# Patient Record
Sex: Female | Born: 1975
Health system: Southern US, Community
[De-identification: ages and names within clinical notes are randomized; demographics above are authoritative.]

## PROBLEM LIST (undated history)

## (undated) DIAGNOSIS — E282 Polycystic ovarian syndrome: Secondary | ICD-10-CM

## (undated) DIAGNOSIS — E559 Vitamin D deficiency, unspecified: Secondary | ICD-10-CM

## (undated) DIAGNOSIS — E669 Obesity, unspecified: Secondary | ICD-10-CM

## (undated) DIAGNOSIS — K5900 Constipation, unspecified: Secondary | ICD-10-CM

## (undated) DIAGNOSIS — M2550 Pain in unspecified joint: Secondary | ICD-10-CM

## (undated) DIAGNOSIS — F419 Anxiety disorder, unspecified: Secondary | ICD-10-CM

## (undated) DIAGNOSIS — E739 Lactose intolerance, unspecified: Secondary | ICD-10-CM

## (undated) DIAGNOSIS — D573 Sickle-cell trait: Secondary | ICD-10-CM

## (undated) DIAGNOSIS — R450 Nervousness: Secondary | ICD-10-CM

## (undated) HISTORY — DX: Pain in unspecified joint: M25.50

## (undated) HISTORY — DX: Anxiety disorder, unspecified: F41.9

## (undated) HISTORY — DX: Polycystic ovarian syndrome: E28.2

## (undated) HISTORY — DX: Lactose intolerance, unspecified: E73.9

## (undated) HISTORY — DX: Nervousness: R45.0

## (undated) HISTORY — DX: Vitamin D deficiency, unspecified: E55.9

## (undated) HISTORY — PX: CERVICAL CERCLAGE: SHX1329

## (undated) HISTORY — DX: Obesity, unspecified: E66.9

---

## 2004-03-17 ENCOUNTER — Ambulatory Visit (HOSPITAL_COMMUNITY): Admission: RE | Admit: 2004-03-17 | Discharge: 2004-03-17 | Payer: Self-pay | Admitting: Obstetrics and Gynecology

## 2004-05-11 ENCOUNTER — Encounter (HOSPITAL_COMMUNITY): Admission: RE | Admit: 2004-05-11 | Discharge: 2004-06-10 | Payer: Self-pay | Admitting: Obstetrics and Gynecology

## 2004-06-05 ENCOUNTER — Inpatient Hospital Stay (HOSPITAL_COMMUNITY): Admission: AD | Admit: 2004-06-05 | Discharge: 2004-08-02 | Payer: Self-pay | Admitting: Obstetrics and Gynecology

## 2004-08-04 ENCOUNTER — Encounter (HOSPITAL_COMMUNITY): Admission: AD | Admit: 2004-08-04 | Discharge: 2004-09-03 | Payer: Self-pay | Admitting: Obstetrics and Gynecology

## 2004-08-11 ENCOUNTER — Inpatient Hospital Stay (HOSPITAL_COMMUNITY): Admission: AD | Admit: 2004-08-11 | Discharge: 2004-08-11 | Payer: Self-pay | Admitting: Obstetrics and Gynecology

## 2004-09-10 ENCOUNTER — Encounter (HOSPITAL_COMMUNITY): Admission: RE | Admit: 2004-09-10 | Discharge: 2004-09-15 | Payer: Self-pay | Admitting: Obstetrics and Gynecology

## 2004-09-14 ENCOUNTER — Inpatient Hospital Stay (HOSPITAL_COMMUNITY): Admission: AD | Admit: 2004-09-14 | Discharge: 2004-09-19 | Payer: Self-pay | Admitting: Obstetrics and Gynecology

## 2004-09-17 ENCOUNTER — Encounter (INDEPENDENT_AMBULATORY_CARE_PROVIDER_SITE_OTHER): Payer: Self-pay | Admitting: Specialist

## 2004-12-27 ENCOUNTER — Other Ambulatory Visit: Admission: RE | Admit: 2004-12-27 | Discharge: 2004-12-27 | Payer: Self-pay | Admitting: Obstetrics and Gynecology

## 2006-03-07 ENCOUNTER — Other Ambulatory Visit: Admission: RE | Admit: 2006-03-07 | Discharge: 2006-03-07 | Payer: Self-pay | Admitting: Obstetrics and Gynecology

## 2006-03-11 ENCOUNTER — Encounter: Admission: RE | Admit: 2006-03-11 | Discharge: 2006-03-11 | Payer: Self-pay | Admitting: Obstetrics and Gynecology

## 2006-07-11 ENCOUNTER — Other Ambulatory Visit: Admission: RE | Admit: 2006-07-11 | Discharge: 2006-07-11 | Payer: Self-pay | Admitting: Obstetrics and Gynecology

## 2007-12-18 HISTORY — PX: TONSILLECTOMY AND ADENOIDECTOMY: SUR1326

## 2009-06-24 ENCOUNTER — Ambulatory Visit (HOSPITAL_COMMUNITY): Admission: RE | Admit: 2009-06-24 | Discharge: 2009-06-24 | Payer: Self-pay | Admitting: Obstetrics and Gynecology

## 2009-06-24 ENCOUNTER — Encounter (INDEPENDENT_AMBULATORY_CARE_PROVIDER_SITE_OTHER): Payer: Self-pay | Admitting: Obstetrics and Gynecology

## 2009-07-20 ENCOUNTER — Ambulatory Visit: Payer: Self-pay | Admitting: Family Medicine

## 2009-07-20 DIAGNOSIS — Z6838 Body mass index (BMI) 38.0-38.9, adult: Secondary | ICD-10-CM

## 2010-04-18 ENCOUNTER — Ambulatory Visit (HOSPITAL_COMMUNITY): Admission: RE | Admit: 2010-04-18 | Discharge: 2010-04-18 | Payer: Self-pay | Admitting: Obstetrics and Gynecology

## 2010-07-04 ENCOUNTER — Observation Stay (HOSPITAL_COMMUNITY): Admission: AD | Admit: 2010-07-04 | Discharge: 2010-07-05 | Payer: Self-pay | Admitting: Obstetrics and Gynecology

## 2010-07-12 ENCOUNTER — Inpatient Hospital Stay (HOSPITAL_COMMUNITY): Admission: AD | Admit: 2010-07-12 | Discharge: 2010-08-07 | Payer: Self-pay | Admitting: Obstetrics and Gynecology

## 2010-08-27 ENCOUNTER — Inpatient Hospital Stay (HOSPITAL_COMMUNITY): Admission: AD | Admit: 2010-08-27 | Discharge: 2010-08-27 | Payer: Self-pay | Admitting: Obstetrics and Gynecology

## 2010-09-20 ENCOUNTER — Inpatient Hospital Stay (HOSPITAL_COMMUNITY)
Admission: AD | Admit: 2010-09-20 | Discharge: 2010-09-20 | Payer: Self-pay | Source: Home / Self Care | Admitting: Obstetrics and Gynecology

## 2010-09-21 ENCOUNTER — Observation Stay (HOSPITAL_COMMUNITY): Admission: AD | Admit: 2010-09-21 | Discharge: 2010-09-22 | Payer: Self-pay | Admitting: Obstetrics and Gynecology

## 2010-09-26 ENCOUNTER — Inpatient Hospital Stay (HOSPITAL_COMMUNITY): Admission: AD | Admit: 2010-09-26 | Discharge: 2010-09-28 | Payer: Self-pay | Admitting: Obstetrics and Gynecology

## 2010-09-26 ENCOUNTER — Encounter (INDEPENDENT_AMBULATORY_CARE_PROVIDER_SITE_OTHER): Payer: Self-pay | Admitting: Obstetrics and Gynecology

## 2010-10-01 ENCOUNTER — Ambulatory Visit: Admission: RE | Admit: 2010-10-01 | Discharge: 2010-10-01 | Payer: Self-pay | Admitting: Obstetrics and Gynecology

## 2011-01-07 ENCOUNTER — Encounter: Payer: Self-pay | Admitting: Obstetrics and Gynecology

## 2011-01-14 LAB — CONVERTED CEMR LAB
Blood in Urine, dipstick: NEGATIVE
Nitrite: NEGATIVE
Specific Gravity, Urine: <1.005
WBC Urine, dipstick: NEGATIVE

## 2011-02-28 LAB — CBC
HCT: 28.1 % — ABNORMAL LOW (ref 36.0–46.0)
HCT: 30.7 % — ABNORMAL LOW (ref 36.0–46.0)
Hemoglobin: 10.4 g/dL — ABNORMAL LOW (ref 12.0–15.0)
MCH: 27.9 pg (ref 26.0–34.0)
MCH: 29 pg (ref 26.0–34.0)
MCHC: 33.7 g/dL (ref 30.0–36.0)
MCHC: 34 g/dL (ref 30.0–36.0)
MCV: 82.7 fL (ref 78.0–100.0)
RDW: 16.1 % — ABNORMAL HIGH (ref 11.5–15.5)

## 2011-02-28 LAB — RPR: RPR Ser Ql: NONREACTIVE

## 2011-03-01 LAB — WET PREP, GENITAL
Clue Cells Wet Prep HPF POC: NONE SEEN
Trich, Wet Prep: NONE SEEN

## 2011-03-01 LAB — URINALYSIS, ROUTINE W REFLEX MICROSCOPIC
Glucose, UA: NEGATIVE mg/dL
Specific Gravity, Urine: 1.01 (ref 1.005–1.030)

## 2011-03-01 LAB — URINE MICROSCOPIC-ADD ON

## 2011-03-01 LAB — FETAL FIBRONECTIN: Fetal Fibronectin: NEGATIVE

## 2011-03-01 LAB — URINE CULTURE

## 2011-03-02 LAB — DIFFERENTIAL
Basophils Absolute: 0 10*3/uL (ref 0.0–0.1)
Basophils Relative: 0 % (ref 0–1)
Eosinophils Relative: 5 % (ref 0–5)
Lymphocytes Relative: 13 % (ref 12–46)

## 2011-03-02 LAB — CBC
MCHC: 34 g/dL (ref 30.0–36.0)
MCV: 87.1 fL (ref 78.0–100.0)
Platelets: 223 10*3/uL (ref 150–400)
RDW: 15 % (ref 11.5–15.5)
WBC: 7.2 10*3/uL (ref 4.0–10.5)

## 2011-03-02 LAB — GLUCOSE, 1 HOUR GESTATIONAL: Glucose Tolerance, 1 hour: 141 mg/dL (ref 70–189)

## 2011-03-02 LAB — GLUCOSE TOLERANCE, 1 HOUR: Glucose, 1 Hour GTT: 153 mg/dL — ABNORMAL HIGH (ref 70–140)

## 2011-03-06 LAB — CBC
HCT: 34.4 % — ABNORMAL LOW (ref 36.0–46.0)
Hemoglobin: 11.8 g/dL — ABNORMAL LOW (ref 12.0–15.0)
MCHC: 34.1 g/dL (ref 30.0–36.0)
MCV: 88.1 fL (ref 78.0–100.0)
RBC: 3.91 MIL/uL (ref 3.87–5.11)
WBC: 9.5 10*3/uL (ref 4.0–10.5)

## 2011-03-25 LAB — URINE MICROSCOPIC-ADD ON

## 2011-03-25 LAB — CBC
Hemoglobin: 12.2 g/dL (ref 12.0–15.0)
MCHC: 34.5 g/dL (ref 30.0–36.0)
RBC: 4.29 MIL/uL (ref 3.87–5.11)
WBC: 5.1 10*3/uL (ref 4.0–10.5)

## 2011-03-25 LAB — PREGNANCY, URINE: Preg Test, Ur: NEGATIVE

## 2011-03-25 LAB — URINALYSIS, ROUTINE W REFLEX MICROSCOPIC
Bilirubin Urine: NEGATIVE
Glucose, UA: NEGATIVE mg/dL
Ketones, ur: NEGATIVE mg/dL
Protein, ur: NEGATIVE mg/dL
pH: 6.5 (ref 5.0–8.0)

## 2011-04-03 ENCOUNTER — Encounter: Payer: Self-pay | Admitting: Family Medicine

## 2011-04-03 ENCOUNTER — Ambulatory Visit (INDEPENDENT_AMBULATORY_CARE_PROVIDER_SITE_OTHER): Payer: 59 | Admitting: Family Medicine

## 2011-04-03 VITALS — BP 120/60 | Temp 98.6°F | Wt 234.0 lb

## 2011-04-03 DIAGNOSIS — R079 Chest pain, unspecified: Secondary | ICD-10-CM

## 2011-04-03 DIAGNOSIS — H10029 Other mucopurulent conjunctivitis, unspecified eye: Secondary | ICD-10-CM

## 2011-04-03 DIAGNOSIS — E669 Obesity, unspecified: Secondary | ICD-10-CM

## 2011-04-03 DIAGNOSIS — H109 Unspecified conjunctivitis: Secondary | ICD-10-CM

## 2011-04-03 MED ORDER — MOXIFLOXACIN HCL 0.5 % OP SOLN: 1.0000 [drp] | Freq: Three times a day (TID) | OPHTHALMIC | Status: AC

## 2011-04-03 MED ORDER — PHENTERMINE HCL 37.5 MG PO CAPS: 37.5000 mg | ORAL_CAPSULE | ORAL | Status: AC

## 2011-04-03 NOTE — Patient Instructions (Signed)
Obesity Obesity is defined as having a Body Mass Index (BMI) of 30 or more. To calculate your BMI divide your weight in pounds by your height in inches squared and multiply that product by 703. Major illnesses resulting from long-term obesity include:  Stroke.   Heart disease.   Diabetes.   Many cancers.   Arthritis.  Obesity also complicates recovery from many other medical problems.  CAUSES  A history of obesity in your parents.   Thyroid hormone imbalance.   Environmental factors such as excess calorie intake and physical inactivity.  TREATMENT A healthy weight loss program includes:  A calorie restricted diet based on individual calorie needs.   Increased physical activity (exercise).  An exercise program is just as important as the right low calorie diet.  Weight-loss medicines should be used only under the supervision of your physician. These medicines help, but only if they are used with diet and exercise programs. Medicines can have side effects including nervousness, nausea, abdominal pain, diarrhea, headache, drowsiness, and depression.  An unhealthy weight loss program includes:  Fasting.   Fad diets.   Supplements and drugs.  These choices do not succeed in long-term weight control.  HOME CARE INSTRUCTIONS To help you make the needed dietary changes:   Keep a daily record of everything you eat. There are many free websites to help you with this. It may be helpful to measure your foods so you can determine if you are eating the correct portion sizes.   Use low-calorie cookbooks or take special cooking classes.   Avoid alcohol. Drink more water and drinks with no calories.   Take vitamins and supplements only as recommended by your caregiver.   Weight-loss support groups, Registered Dieticians, counselors, and stress reduction education can also be very helpful.  PREVENTION Losing weight and keeping it off takes time, discipline, a healthy diet and regular  exercise. Document Released: 01/10/2005 Document Re-Released: 12/23/2007 Exit[employer] Patient Information 2011 [employer]. 

## 2011-04-03 NOTE — Progress Notes (Signed)
  Subjective:    [name] is a 35 y.o. female who presents for evaluation of discharge, erythema and itching in the left eye. She has noticed the above symptoms for 5 days. Onset was gradual. Patient denies blurred vision, foreign body sensation, pain, photophobia, tearing and visual field deficit. There is a history of other family members with similar symptoms.  The following portions of the patient's history were reviewed and updated as appropriate: allergies, current medications, past family history, past medical history, past social history, past surgical history and problem list.  Review of Systems Pertinent items are noted in HPI.   Objective:    BP 120/60  Temp(Src) 98.6 F (37 C) (Oral)  Wt 234 lb (106.142 kg)      General: alert, cooperative, appears stated age and no distress  Eyes:  positive findings: conjunctiva: 1+ injection.  Yellow d/c  Vision:   20/15 both eyes  Fluorescein:  not done   lungs--CTAB/L,  No RRW Heart-- +S1S2 no murmur  Assessment:     Acute conjunctivitis  obesity  Plan:    Ophthalmic drops per orders.   diet and exercise d/w pt--- preventation diet--Flat Belly diet

## 2011-05-01 NOTE — H&P (Signed)
NAMELOANNE, EMERY                ACCOUNT NO.:  [id number]   MEDICAL RECORD NO.:  [id number]          PATIENT TYPE:  AMB   LOCATION:  SDC                           [facility]  [facility]   PHYSICIAN:  Janine Limbo, M.D.DATE OF BIRTH:  [birth year]   DATE OF ADMISSION:  DATE OF DISCHARGE:                              HISTORY & PHYSICAL   HISTORY OF PRESENT ILLNESS:  Ms. [name] is a 35 year old female, para 0-  2-0-1, who presents for hysteroscopy with resection and dilatation and  curettage.  The patient has been followed at the [facility] of [facility] for women.  The patient has irregular  bleeding.  An ultrasound was performed, which showed an 8.73 x 6.00 cm  uterus with endometrial polyps.  A total of 8 polyps were seen, and the  largest polyp measures 1.65 cm in size.  The patient also has a history  of anemia.  Her hemoglobin was 11.3.  Her most recent Pap smear was  within normal limits.   OBSTETRICAL HISTORY:  The patient has had a 35-week vaginal delivery.  She has also had a 20-week delivery of a nonviable infant.   DRUG ALLERGIES:  No known drug allergies.   PAST MEDICAL HISTORY:  The patient has a history of sickle cell trait.  She also has a history of hematuria of uncertain etiology.   SOCIAL HISTORY:  The patient denies cigarette use and other recreational  drug uses.  She is currently married and she works as a Engineer, civil (consulting).   REVIEW OF SYSTEMS:  The patient has constipation and fatigue.   FAMILY HISTORY:  Noncontributory.   PHYSICAL EXAMINATION:  VITAL SIGNS:  Weight is 214 pounds.  Height is 5  feet 6 inches.  HEENT:  Within normal limits.  CHEST:  Clear.  HEART:  Regular rate and rhythm.  BREASTS:  Without masses.  ABDOMEN:  Nontender.  EXTREMITIES:  Grossly normal.  NEUROLOGIC:  Grossly normal.  PELVIC:  External genitalia is normal.  Vagina is normal.  Cervix is  nontender.  Uterus is normal size, shape, and consistency.  Adnexa,  no  masses.   ASSESSMENT:  1. Irregular uterine bleeding.  2. Endometrial polyps.  3. Anemia (hemoglobin 11.3).   PLAN:  The patient will undergo a hysteroscopy with resection of polyps.  She will also have a dilatation and curettage.  The patient understands  the indications for her surgical procedure and she accepts the risks of,  but not limited to, anesthetic complications, bleeding, infection, and  possible damage to surrounding organs.      Janine Limbo, M.D.  Electronically Signed     AVS/MEDQ  D:  06/15/2009  T:  06/16/2009  Job:  [id number]

## 2011-05-01 NOTE — Op Note (Signed)
[name], [name]                ACCOUNT NO.:  [id number]   MEDICAL RECORD NO.:  [id number]          PATIENT TYPE:  AMB   LOCATION:  SDC                           [facility]   PHYSICIAN:  Janine Limbo, M.D.DATE OF BIRTH:  Apr 22, 1976   DATE OF PROCEDURE:  06/24/2009  DATE OF DISCHARGE:  06/24/2009                               OPERATIVE REPORT   PREOPERATIVE DIAGNOSES:  1. Menorrhagia.  2. Endometrial polyps.  3. Anemia (hemoglobin 11.3 in the office.)   POSTOPERATIVE DIAGNOSES:  1. Menorrhagia.  2. Endometrial polyps.  3. Anemia (hemoglobin 11.3 in the office.)   PROCEDURE:  1. Hysteroscopy with resection of polyps.  2. Dilatation and curettage.   SURGEON:  Janine Limbo, MD   FIRST ASSISTANT:  None.   ANESTHETIC:  General.   DISPOSITION:  [name] is a 35 year old female, para 0-2-0-1, who  presents with the above-mentioned diagnosis.  She understands the  indications for her surgical procedure and she accepts the risks of, but  not limited to, anesthetic complications, bleeding, infections, and  possible damage to the surrounding organs.   FINDINGS:  The uterus was noted to be normal size and consistency.  No  adnexal masses were appreciated.  On hysteroscopy, the patient was noted  to have multiple endometrial polyps.  No other pathology was  appreciated.   PROCEDURE:  The patient was taken to the operating room where a general  anesthetic was given.  The patient's abdomen, perineum, and vagina were  prepped with multiple layers of Betadine.  The bladder was drained of  urine.  The patient was sterilely draped.  Examination under anesthesia  was performed.  A paracervical block was placed using 10 mL of 0.5%  Marcaine with epinephrine.  An endocervical curettage was performed.  The uterus sounded to 9 cm.  The cervix was gently dilated.  The  operative hysteroscope was inserted.  Pictures were taken of the  patient's intrauterine anatomy.  We  used a single loop to resect all of  the polyps in the endometrial cavity.  We then used a medium sharp  curette to curette the cavity until it was felt to be clean.  Hemostasis  was noted to be adequate.  The hysteroscope was reinserted and the  cavity was carefully inspected.  No other pathology was appreciated.  All instruments were removed.  The examination was repeated and the  uterus was noted to be firm.  Sponge, needle, and instrument counts were  correct on 2 occasions.  The estimated blood loss was 10 mL.  The  estimated fluid deficit was 40 mL of glycine.  The patient tolerated her  procedure well.  She was awakened from her anesthetic without difficulty  and transported to the recovery room in stable condition.  The  endocervical curettage, endometrial curettage, and the endometrial  resections were sent to pathology for evaluation.   FOLLOWUP INSTRUCTIONS:  The patient was given a prescription for:  1. Motrin 800 mg every 8 hours as needed for mild-to-moderate pain.  2. Vicodin 1 or 2 tablets every 4 hours as  needed for severe pain.  3. Zofran 8 mg every 8 hours as needed for nausea.  The patient will      return to see Dr. Stefano Gaul in 2-3 weeks for followup examination.      She was given a copy of the postoperative instruction sheet as      prepared by the [facility] for patient's, who      have undergone a dilatation and curettage.       Janine Limbo, M.D.  Electronically Signed     AVS/MEDQ  D:  06/24/2009  T:  06/25/2009  Job:  [id number]

## 2011-05-04 NOTE — H&P (Signed)
[name], [name]                ACCOUNT NO.:  [id number]   MEDICAL RECORD NO.:  [id number]          PATIENT TYPE:  INP   LOCATION:  9169                          [facility]ILITY[facility]  [facility]   PHYSICIAN:  Marie L. Williams, C.N.M.DATE OF BIRTH:  1976-06-27   DATE OF ADMISSION:  09/14/2004  DATE OF DISCHARGE:                                HISTORY & PHYSICAL   HISTORY OF PRESENT ILLNESS:  This is a 35 year old gravida 2, para 1-0-0 at  34-4/7 weeks who presents from the office with cervix dilated 3-4 cm, 90%  effaced and bulging membranes with cerclage half way out.  She denies  leaking or bleeding and reports positive fetal movement.  Pregnancy has been  followed by the M.D. service and remarkable for:  1) Previous 20 week loss.  2) Preterm labor with preterm delivery.  3) Sickle cell trait.  4) Group B  strep positive.  5) Preterm labor this pregnancy.   PAST OBSTETRICAL HISTORY:  Remarkable for vaginal delivery in 1997 of a  female infant at 15 weeks' gestation who died two hours after birth.   PAST MEDICAL HISTORY:  1.  History of for incompetent cervix in 1997 with preterm birth.  2.  History of recurrent yeast infections.  3.  Childhood varicella.   PAST SURGICAL HISTORY:  Remarkable only for childbirth.   FAMILY HISTORY:  Remarkable for a grandmother with heart disease.  Mother  and grandfather with hypertension.  Aunt with varicosities.  Aunt with non-  insulin-dependent diabetes mellitus.  Aunt with renal disease.  Grandfather  with prostate cancer.  Grandmother with rectal cancer.  Grandfather with  stroke.   GENETIC HISTORY:  Remarkable for a patient with sickle cell trait in the  patient's cousin with sickle cell trait.   SOCIAL HISTORY:  The patient is single.  The father of the baby, __________  [name], is intermittently involved.  The patient works as a Theatre stage manager  in [employer]  She is of the [employer].  She denies any alcohol,  tobacco or drug use.   PRENATAL LABORATORY DATA:  Hemoglobin 12.1, platelets 292,000.  Blood type B  positive.  Antibody screen negative.  Sickle cell positive for trait.  RPR  nonreactive.  Rubella immune.  Hepatitis negative.  HIV negative.  Hepatitis  test normal.  Gonorrhea negative.  Chlamydia negative.  Cystofibrosis  negative.  Clot screen normal.  Group B strep positive.   HISTORY OF CURRENT PREGNANCY:  The patient entered care at 10 weeks'  gestation. She was counseled on Delalutin protocol for history of preterm  delivery which began at 16 weeks on a weekly basis.  She was noted to be 2-3  cm dilated at 19 weeks and was admitted to the hospital for cerclage where  she stayed for several weeks.  Since then, she has been able to go home and  has done well at home and presents now in early labor.   PHYSICAL EXAMINATION:  VITAL SIGNS:  Stable, afebrile.  HEENT:  Within normal limits.  NECK:  Thyroid normal, not enlarged.  CHEST:  Clear  to auscultation.  HEART:  Regular rate and rhythm.  ABDOMEN:  EFM shows reactive fetal heart rate with uterine contractions  every 1-1/2 to two minutes which are mild.  The cervix is 3-4 cm, 90%,  bulging membranes per Dr. Su Hilt.  Cerclage is noted to be half way out.  Extremities within normal limits.   ASSESSMENT:  1.  Intrauterine pregnancy at 34-4/7 weeks.  2.  Preterm cervical dilation with preterm labor.  3.  Cerclage.   PLAN:  1.  Admit to birthing suites per Dr. Su Hilt.  2.  Routine M.D. orders.  3.  Plan per M.D.      MLW/MEDQ  D:  09/14/2004  T:  09/14/2004  Job:  [id number]

## 2011-05-04 NOTE — Op Note (Signed)
NAMESHIORI, ADCOX                          ACCOUNT NO.:  [id number]   MEDICAL RECORD NO.:  [id number]                   PATIENT TYPE:  INP   LOCATION:  9151                                 [facility]ILITY[facility]  [facility]   PHYSICIAN:  Janine Limbo, M.D.            DATE OF BIRTH:  23-Jul-1976   DATE OF PROCEDURE:  06/07/2004  DATE OF DISCHARGE:                                 OPERATIVE REPORT   PREOPERATIVE DIAGNOSES:  1. [redacted] weeks gestation.  2. Incompetent cervix.   POSTOPERATIVE DIAGNOSES:  1. [redacted] weeks gestation.  2. Incompetent cervix.   PROCEDURE:  Cervical cerclage as a rescue procedure.   OBSTETRICIAN:  Janine Limbo, M.D.   ANESTHESIA:  Spinal.   DISPOSITION:  [name] is a 35 year old female, gravida 2, para 0-1-0-0,  who presented to the [facility] on June 05, 2004 when  she was noted to be dilated 2-3 cm and 90% effaced.  The patient was given  antibiotics and placed in Trendelenburg position.  She presents at this  point for a cervical cerclage as a rescue procedure.  We carefully reviewed  the risk and benefits of observation only as well as the risks and benefits  of cerclage as a rescue procedure.  The patient understands that there is a  significant risk of infection whether she has the cerclage or not. She  understands that there is a significant risk of bleeding as we do the  procedure. She understands that there is a 50% risk according to some  studies that her membranes will ruptured at the time the procedure is  performed.  She understands, however, that without the cerclage that the  natural history of condition is that she will deliver in the near future and  that her infant at this point has no chance of survival. After carefully  considering all of her risks, she has decided to proceed with cervical  cerclage.   FINDINGS:  The patient presented to the operating room and on speculum exam,  she was found to be 3-4 cm dilated at this  point. This is a significant  change from when she was first admitted two days ago at which point her  cervix was 2-3 cm dilated.  The membranes are slightly bulging today.  We  did review these findings prior to proceeding with the procedure. The  patient again stated that it was her desire that we proceed. The perineum  and vagina were prepped with multiple layers of Betadine.  A Foley catheter  was placed in the bladder. The patient was sterilely draped.  A speculum was  placed in the vagina and the vagina was very carefully prepped with  Betadine.  The cervix was held using special ring forceps.  We attempted to  place a Foley catheter in the cervix to push the membranes back. We felt  that this was a suboptimal method for placing  the cerclage.  We then used a  narrow malleable retractor to gently retract the membranes to the side.  We  were able to place a 5 mm Mersilene stitch in a circumferential pursestring  fashion around the cervix without damage the membranes.  The knot was tied  in the posterior vagina.  Hemostasis was adequate at the end of our  procedure. The cervix was noted to be closed.  The cervix appeared to be  approximately 25-50% effaced at the end of our procedure. We were very  pleased with the  result.  Sponge, needle and instrument counts were correct.  The estimated  blood loss for the procedure was 30 mL.  The patient was taken to the  recovery room in stable condition. We will keep the patient in the hospital  for observation.  If she remains stable, then we will consider discharging  the patient to home.                                               Janine Limbo, M.D.    AVS/MEDQ  D:  06/07/2004  T:  06/07/2004  Job:  [id number]

## 2011-05-04 NOTE — H&P (Signed)
NAME:  [name], [name]                          ACCOUNT NO.:  [id number]   MEDICAL RECORD NO.:  [id number]                   PATIENT TYPE:  INP   LOCATION:  9151                                 [facility]   PHYSICIAN:  Crist Fat. Rivard, M.D.              DATE OF BIRTH:  [birth year]   DATE OF ADMISSION:  06/05/2004  DATE OF DISCHARGE:                                HISTORY & PHYSICAL   Ms. [name] is a 35 year old gravida 2, para 0-0-1-0 at 19-4/7 weeks who  presents from the office with cervical dilatation noted on ultrasound of 2  to 3 cm and no measurable cervix.  Her history had been remarkable for a 20-  week delivery in 1997 with onset of contractions, cramping, and then  ruptured membranes with subsequently delivery.  The patient has denied any  significant cramping, discharge, bleeding, or any other problems.  The  ultrasound that was done today was done for a routine anatomy ultrasound.  Pregnancy has been remarkable for 1) previous delivery at 20 weeks.  2)  History of sickle cell trait.  3) Patient on Delalutin weekly secondary to  history of preterm labor.   PRENATAL LABORATORY DATA:  Blood type B positive, Rh antibody negative, VDRL  nonreactive, rubella titer positive, hepatitis B surface antigen negative,  HIV nonreactive, GC and chlamydia cultures negative.  In April, Pap was  normal.  In December 2004, cystic fibrosis testing was negative.  Quadruple  screen was negative.  Hemoglobin upon entering the practice was 12.1.  Her  platelets were 292.  Sickle cell trait was positive.  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  EDC of October 22, 2004, was established by  ultrasound at 8 weeks secondary to somewhat questionable last menstrual  period.   HISTORY OF PRESENT PREGNANCY:  The patient entered care at approximately 10  weeks.  Recent information was sent to [facility] for her history of onset  of labor at 20 weeks.  Hemoglobin electrophoresis showed  positive sickle  cell trait and possible beta thalassemia trait.  Dr. Genia Hotter consulted, and  Delalutin was begun at 16 weeks.  She was sent to Dr. Cyndie Chime for a  consult secondary to questionable beta thalassemia trait.  She had  appointment with him on approximately June 6.  No records are available at  present of that visit.  The patient had an ultrasound at approximately 16  weeks that showed a cervical length of 3.0.  Her anatomy ultrasound today  then showed a cervical dilatation of 2 to 3 cm with no measurable cervix.   PAST OBSTETRICAL HISTORY:  In 1997, the patient had a vaginal delivery of a  20-week female infant after 7 to 8 hours in labor.  She then had ruptured  membranes.  The baby lives 2 hours.   PAST MEDICAL HISTORY:  1. History of some recurrent yeast infections.  2. She  was on oral contraceptives until February 2005.  3. She reports the usual childhood illnesses.  4. She had one UTI in the past.  5. The patient also has sickle cell trait.   ALLERGIES:  No known medication allergies.   FAMILY HISTORY:  Maternal grandmother had heart disease and is now deceased.  Maternal mother and paternal grandfather had chronic hypertension.  Paternal  aunt had varicosities.  Paternal great grandfather had non-insulin-dependent  diabetes.  Maternal aunt had chronic renal disease.  Grandfather had  prostate cancer.  Paternal grandmother had rectal cancer.  Paternal  grandfather had a stroke.  Father and mother were alcohol and drug users.   HOSPITALIZATION:  The patient's only hospitalization was for childbirth.   GENETIC HISTORY:  Remarkable for patient having sickle cell trait and  patient's cousin having trait.   SOCIAL HISTORY:  The patient just graduated form [employer] nursing school.  Her  partner has two years of college.  He is employed as a Programmer, applications.  She is single.  Father of the baby is involved and supportive.  His name is Perlie Mayo.  The patient  is African-American of [address]  faith.  She has been followed by the physician's service at [facility].  She denies any alcohol, drug, or tobacco use during this  pregnancy.   PHYSICAL EXAMINATION:  VITAL SIGNS:  Stable.  The patient is afebrile.  HEENT:  Within normal limits.  LUNGS:  Bilateral breaths sounds are clear.  HEART:  Regular rate and rhythm without murmur.  BREASTS:  Soft and nontender.  ABDOMEN:  Fundal height is approximately 19 to 20 weeks.  PELVIC:  Cervical exam was done by Dr. Stefano Gaul.  Cervix 2 to 3 cm, 90%  effaced, membranes not bulging.  GC, chlamydia and group B strep cultures  were collected.  EXTREMITIES:  Deep tendon reflexes 2+ without clonus.  There was no edema  noted.   IMPRESSION:  1. Intrauterine pregnancy at 19-4/7 weeks.  2. Cervical incompetence.  3. History of previous 20-week loss.   PLAN:  1. Admit to [facility] for consult with Dr. Silverio Lay, as attending physician.  2. Complete bed rest in Trendelenburg with Foley.  3. Unasyn IV until culture results are available.  4. Motrin 600 mg p.o. q.6h.  5. Continue with tocolysis p.r.n.  6. Dr. Estanislado Pandy has reviewed the reserved prognosis at this time as well as     risks and benefits of rescue cerclage.  The plan will be most likely be     attempt to place this after 24 to 48 hours after antibiotic coverage.     Renaldo Reel Emilee Hero, C.N.M.                   Crist Fat Rivard, M.D.    Leeanne Mannan  D:  06/05/2004  T:  06/05/2004  Job:  [id number]

## 2011-05-04 NOTE — Discharge Summary (Signed)
NAME:  [name], [name]                          ACCOUNT NO.:  [id number]   MEDICAL RECORD NO.:  [id number]                   PATIENT TYPE:  INP   LOCATION:  9151                                 FACILITY:  [facility]   PHYSICIAN:  Naima A. Dillard, M.D.              DATE OF BIRTH:  Jan 23, 1976   DATE OF ADMISSION:  06/05/2004  DATE OF DISCHARGE:  08/02/2004                                 DISCHARGE SUMMARY   ADMITTING DIAGNOSIS:  Intrauterine pregnancy at 19 weeks 4 days with  cervical incompetence.   PROCEDURE:  Rescue cervical cerclage at [redacted] weeks gestation for incompetent  cervix.   DISCHARGE DIAGNOSES:  1. Intrauterine pregnancy at 19 weeks 4 days with cervical incompetence.  2. Rescue cervical cerclage at [redacted] weeks gestation for incompetent cervix.   [name] is a 35 year old gravida 2 para 0-0-1-0 who presented at 19 weeks  4 days gestation having been seen in the office on that day by Dr. Stefano Gaul  for routine prenatal visit.  She had a routine anatomy scan on ultrasound  which revealed no cervical length.  She was evaluated by Dr. Stefano Gaul and  the cervix at that time was 2-3 cm dilated, 90% effaced, membranes not  bulging and intact.  The patient was admitted without any signs of preterm  labor and good fetal movement for bedrest in Trendelenburg position.  She  was advised of the prognosis of cervical dilation in early pregnancy prior  to fetal viability.  The patient understands and elects to remain in the  hospital on bedrest.  Discussion was made with the patient regarding her  options including the risks and benefits of observation only and the risks  of rescue cervical cerclage.  The patient decided to proceed with cerclage  which was performed on June 07, 2004 by Dr. Marline Backbone.  The patient  has remained in the hospital on bedrest since that time.  She has had no  symptoms of preterm labor since admission and has remained stable in her  vital signs with no signs of  infection, no rupture of membranes, no  bleeding, no contractions.  Her course has in that respect remained  uncomplicated.  She has received physical therapy for prolonged bedrest.  She has had social work and chaplain consults.  She has had some difficulty  with periods of depression and has had some breakdown of the skin on her  buttocks which has been relieved with cream.  She was followed with  ultrasound at 24 weeks which showed cervical length on ultrasound 1.5 cm.  She received betamethasone at that time at 24 weeks on July 05, 2004.  She  received betamethasone on that day and 24 hours later.  The ultrasound  specifically showed a vertex presentation with approximate estimated fetal  weight of 616 g, AFI lower limit of normal, cervix 1.5 cm in length, 1.1 cm  dilation of internal os  with funneling.  At that time discussion was held  with the patient regarding her status with regard to incompetent cervix,  cervical length and dilation, and dynamic cervix.  Decision was made to  remain in Trendelenburg bedrest until [redacted] weeks gestation with reevaluation  by ultrasound.  She began weekly Delalutin injections at 24 weeks, and again  has remained without any signs or symptoms of infection and no signs or  symptoms of preterm labor.  She has not had any contractions or cramping.  No bleeding or leaking of fluid.  Her fetal heart rate has remained  reassuring with positive fetal movement.  The problems that the patient has  had have revolved around prolonged bedrest as constipation and hemorrhoids,  depression.  At 28 weeks, ultrasound exam was performed which showed an  average gestational age fetus with an estimated fetal weight of 1153 g on  the 50th to 75th percentile, AFI normal, cervical length below cerclage was  1.4 cm of total cervical length, funneling above cerclage to 3.4 cm,  internal os dilated 1.1 to 1.3 cm, essentially unchanged from previous  ultrasound exam.  At this  point at 28 weeks on July 31, 2004 the patient  was allowed to advance activity.  She was sitting up for meals and had  bathroom privileges.  The plan was made to recheck cervix via ultrasound in  48 hours.  Repeat ultrasound was done today on August 02, 2004 with findings  of cervix decreased in length to 1.1 cm with funneling.  Because of cervical  shortening the patient was advised to remain in hospital on bedrest.  However, after long discussion between the patient and Dr. Jaymes Graff,  the patient chose to leave AMA.  However, the patient agreed to home health  nursing and will continue bedrest at home with weekly Delalutin injections  and weekly visits to [facility].  Her next appointment is scheduled on August 11, 2004 with Dr. Jaymes Graff.  She is to start Motrin 600 mg p.o. q.6h. until  Monday - August 07, 2004.  She is to call the office of [facility] for any signs  or symptoms or preterm labor, rupture of membranes, decreased fetal  movement, or temperature greater than 100.4.  All arrangements have been  made with [facility] for the patient's care at home.     Rica Koyanagi, C.N.M.               Naima A. Normand Sloop, M.D.    SDM/MEDQ  D:  08/02/2004  T:  08/02/2004  Job:  [id number]

## 2012-05-19 ENCOUNTER — Encounter: Payer: Self-pay | Admitting: Obstetrics and Gynecology

## 2012-05-19 ENCOUNTER — Ambulatory Visit (INDEPENDENT_AMBULATORY_CARE_PROVIDER_SITE_OTHER): Payer: BC Managed Care – PPO | Admitting: Obstetrics and Gynecology

## 2012-05-19 VITALS — BP 124/70 | Resp 16 | Ht 65.5 in | Wt 234.0 lb

## 2012-05-19 DIAGNOSIS — L680 Hirsutism: Secondary | ICD-10-CM

## 2012-05-19 DIAGNOSIS — Z124 Encounter for screening for malignant neoplasm of cervix: Secondary | ICD-10-CM

## 2012-05-19 DIAGNOSIS — N898 Other specified noninflammatory disorders of vagina: Secondary | ICD-10-CM

## 2012-05-19 DIAGNOSIS — R635 Abnormal weight gain: Secondary | ICD-10-CM | POA: Insufficient documentation

## 2012-05-19 DIAGNOSIS — N949 Unspecified condition associated with female genital organs and menstrual cycle: Secondary | ICD-10-CM

## 2012-05-19 DIAGNOSIS — Z139 Encounter for screening, unspecified: Secondary | ICD-10-CM

## 2012-05-19 DIAGNOSIS — Z01419 Encounter for gynecological examination (general) (routine) without abnormal findings: Secondary | ICD-10-CM

## 2012-05-19 LAB — CBC
Hemoglobin: 12.6 g/dL (ref 12.0–15.0)
MCH: 28.1 pg (ref 26.0–34.0)
MCHC: 33.9 g/dL (ref 30.0–36.0)
MCV: 82.9 fL (ref 78.0–100.0)
RBC: 4.49 MIL/uL (ref 3.87–5.11)

## 2012-05-19 LAB — POCT WET PREP (WET MOUNT): Clue Cells Wet Prep Whiff POC: NEGATIVE
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
## 2012-05-20 LAB — TESTOSTERONE, FREE, TOTAL, SHBG
Sex Hormone Binding: 30 nmol/L (ref 18–114)
Testosterone, Free: 9.4 pg/mL — ABNORMAL HIGH (ref 0.6–6.8)
Testosterone-% Free: 1.9 % (ref 0.4–2.4)

## 2012-05-21 ENCOUNTER — Telehealth: Payer: Self-pay

## 2012-05-21 DIAGNOSIS — E559 Vitamin D deficiency, unspecified: Secondary | ICD-10-CM

## 2012-05-21 LAB — PAP IG W/ RFLX HPV ASCU

## 2012-05-21 NOTE — Telephone Encounter (Signed)
Left message for pt to return call regarding Vit-D protocol. Mathis Bud

## 2012-05-21 NOTE — Progress Notes (Signed)
Dr. Su Hilt, pt was called and given Vit-D protocol, and the pt asked about other lab result. Pt was concerned about her elevated Free Testosterone level. Pt wanted to bring to your attention that she does have facial hair, and is there any protocol regarding that? Mathis Bud

## 2012-05-21 NOTE — Telephone Encounter (Signed)
Pt was called and given Vit-D protocol. RX was called in to [address] for Vit-D softjels 50,000 units 1 capsule 1x wk for 12wks #20 0rf. Pt asked for other test results and was concerned about her elevated Free Testosterone level. Pt was advised that a note will be forwarded to Dr. Su Hilt. The pt was advised that she will receive a call with protocol after Dr. Su Hilt review message. Future lab ordered and also scheduled per pt request. Lab ov for 08/07/2012 @ 11:30am, Mathis Bud

## 2012-05-23 ENCOUNTER — Telehealth: Payer: Self-pay | Admitting: Obstetrics and Gynecology

## 2012-05-26 ENCOUNTER — Telehealth: Payer: Self-pay

## 2012-05-26 NOTE — Telephone Encounter (Signed)
To close document. [name]

## 2012-05-26 NOTE — Telephone Encounter (Signed)
Left message for pt to return call regarding lab results. [name]  

## 2012-05-28 ENCOUNTER — Telehealth: Payer: Self-pay | Admitting: Obstetrics and Gynecology

## 2012-05-29 ENCOUNTER — Telehealth: Payer: Self-pay

## 2012-05-29 NOTE — Telephone Encounter (Signed)
Pt returned call regarding lab results. Pt was advised of elevated free testosterone level suggesting PCO's, pt does not have any questions right now. Pt stated that she will be seeing her PCP soon and will discuss it at that ov. Mathis Bud

## 2012-05-30 ENCOUNTER — Telehealth: Payer: Self-pay | Admitting: Obstetrics and Gynecology

## 2012-05-30 ENCOUNTER — Other Ambulatory Visit: Payer: Self-pay | Admitting: Obstetrics and Gynecology

## 2012-05-30 NOTE — Telephone Encounter (Signed)
TC to pt. LM to return call.  

## 2012-05-30 NOTE — Telephone Encounter (Signed)
Message copied by Mason Jim on Fri May 30, 2012 12:15 PM ------      Message from: [facility], Corrie Dandy      Created: Thu May 29, 2012  7:01 PM      Regarding: pt rx vit d protocol       Pt calls beyond frustrated 3 trips and 2 pharmacies later still no RX at her pharmacy for Vit D protocol and chastised by our staff because she has not started her RX yet. And she has 34 year old twins on all these excursions. Please call Rx in to      [address] (spelling) in [place]. Dr. Su Hilt notifed per pt request of this.

## 2012-05-30 NOTE — Telephone Encounter (Signed)
Left message on pt's voicemail regarding RX being re-called to [facility] for the Vit-D. Pt was called on yesterday regarding another subject and mentioned that she never returned to the pharmacy after her first attempt to pick-up RX and it wasn't ready even though she received text message of RX being ready. Pt was advised on yesterday to please pick-up RX and also advised of the importance of taking the medication.  Pt called this am and stated that she returned to the pharmacy and RX was not there. Pharmacist stated that RX may have been put back as they are only kept for 9 days. Pharmacist stated that RX would be ready to be picked up today at 2:30 pm. Mathis Bud

## 2012-05-30 NOTE — Telephone Encounter (Signed)
Message copied by Mason Jim on Fri May 30, 2012  9:23 AM ------      Message from: [facility], Corrie Dandy      Created: Thu May 29, 2012  7:01 PM      Regarding: pt rx vit d protocol       Pt calls beyond frustrated 3 trips and 2 pharmacies later still no RX at her pharmacy for Vit D protocol and chastised by our staff because she has not started her RX yet. And she has 34 year old twins on all these excursions. Please call Rx in to      [address] (spelling) in [place]. Dr. Su Hilt notifed per pt request of this.

## 2012-05-30 NOTE — Telephone Encounter (Signed)
TC to pt.   States RX for Vit D not available.  Reassured will  Be sure order was done.  Wants called to [facility].  R/S lab as has not started RX.  Apology given for inconvenience.

## 2012-08-07 ENCOUNTER — Other Ambulatory Visit: Payer: BC Managed Care – PPO

## 2012-08-14 ENCOUNTER — Other Ambulatory Visit: Payer: BC Managed Care – PPO

## 2015-06-27 ENCOUNTER — Emergency Department (HOSPITAL_COMMUNITY)
Admission: EM | Admit: 2015-06-27 | Discharge: 2015-06-27 | Disposition: A | Discharge status: Home / Self Care | Payer: 59 | Source: Home / Self Care

## 2015-06-27 ENCOUNTER — Encounter (HOSPITAL_COMMUNITY): Payer: Self-pay | Admitting: Emergency Medicine

## 2015-06-27 DIAGNOSIS — S63601A Unspecified sprain of right thumb, initial encounter: Secondary | ICD-10-CM

## 2015-06-27 NOTE — ED Notes (Signed)
Right thumb injury today.  Jumped a privacy fence, landing on thumb.  Right thumb swollen, weak in thumb.

## 2015-06-27 NOTE — Discharge Instructions (Signed)
You have a sprained right thumb. He keep the hand in the splint when active and lifting heavy objects. 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  Please return in 2 weeks so we can recheck the ligament.   Finger Sprain A finger sprain is a tear in one of the strong, fibrous tissues that connect the bones (ligaments) in your finger. The severity of the sprain depends on how much of the ligament is torn. The tear can be either partial or complete. CAUSES  Often, sprains are a result of a fall or accident. If you extend your hands to catch an object or to protect yourself, the force of the impact causes the fibers of your ligament to stretch too much. This excess tension causes the fibers of your ligament to tear. SYMPTOMS  You may have some loss of motion in your finger. Other symptoms include:  Bruising.  Tenderness.  Swelling. DIAGNOSIS  In order to diagnose finger sprain, your caregiver will physically examine your finger or thumb to determine how torn the ligament is. Your caregiver may also suggest an X-ray exam of your finger to make sure no bones are broken. TREATMENT  If your ligament is only partially torn, treatment usually involves keeping the finger in a fixed position (immobilization) for a short period. To do this, your caregiver will apply a bandage, cast, or splint to keep your finger from moving until it heals. For a partially torn ligament, the healing process usually takes 2 to 3 weeks. If your ligament is completely torn, you may need surgery to reconnect the ligament to the bone. After surgery a cast or splint will be applied and will need to stay on your finger or thumb for 4 to 6 weeks while your ligament heals. HOME CARE INSTRUCTIONS  Keep your injured finger elevated, when possible, to decrease swelling.  To ease pain and swelling, apply ice to your joint twice a day, for 2 to 3 days:  Put ice in a plastic bag.  Place a towel between your  skin and the bag.  Leave the ice on for 15 minutes.  Only take over-the-counter or prescription medicine for pain as directed by your caregiver.  Do not wear rings on your injured finger.  Do not leave your finger unprotected until pain and stiffness go away (usually 3 to 4 weeks).  Do not allow your cast or splint to get wet. Cover your cast or splint with a plastic bag when you shower or bathe. Do not swim.  Your caregiver may suggest special exercises for you to do during your recovery to prevent or limit permanent stiffness. SEEK IMMEDIATE MEDICAL CARE IF:  Your cast or splint becomes damaged.  Your pain becomes worse rather than better. MAKE SURE YOU:  Understand these instructions.  Will watch your condition.  Will get help right away if you are not doing well or get worse. Document Released: 01/10/2005 Document Revised: 02/25/2012 Document Reviewed: 08/06/2011 [facility] Patient Information 2015 [place], [place]. This information is not intended to replace advice given to you by your health care provider. Make sure you discuss any questions you have with your health care provider.
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
## 2015-12-23 MED FILL — LO LOESTRIN FE 1-10 TABLET: 1 MG-10 MCG | 84 days supply | Qty: 84 | Fill #2

## 2015-12-23 MED FILL — VANIQA 13.9% CREAM: 13.9 | 30 days supply | Qty: 45 | Fill #1

## 2016-02-02 DIAGNOSIS — E668 Other obesity: Secondary | ICD-10-CM | POA: Diagnosis not present

## 2016-02-02 DIAGNOSIS — Z713 Dietary counseling and surveillance: Secondary | ICD-10-CM | POA: Diagnosis not present

## 2016-02-16 DIAGNOSIS — Z3041 Encounter for surveillance of contraceptive pills: Secondary | ICD-10-CM | POA: Diagnosis not present

## 2016-02-16 DIAGNOSIS — R32 Unspecified urinary incontinence: Secondary | ICD-10-CM | POA: Diagnosis not present

## 2016-02-16 DIAGNOSIS — Z01419 Encounter for gynecological examination (general) (routine) without abnormal findings: Secondary | ICD-10-CM | POA: Diagnosis not present

## 2016-02-16 DIAGNOSIS — Z124 Encounter for screening for malignant neoplasm of cervix: Secondary | ICD-10-CM | POA: Diagnosis not present

## 2016-02-16 DIAGNOSIS — Z9189 Other specified personal risk factors, not elsewhere classified: Secondary | ICD-10-CM | POA: Diagnosis not present

## 2016-02-16 DIAGNOSIS — Z6837 Body mass index (BMI) 37.0-37.9, adult: Secondary | ICD-10-CM | POA: Diagnosis not present

## 2016-02-16 MED FILL — VESIcare 5 MG TABS: 5 | 45 days supply | Qty: 90 | Fill #0

## 2016-03-12 MED FILL — LO LOESTRIN FE 1-10 TABLET: 1 MG-10 MCG | 84 days supply | Qty: 84 | Fill #3

## 2016-04-03 DIAGNOSIS — N3281 Overactive bladder: Secondary | ICD-10-CM | POA: Diagnosis not present

## 2016-04-03 DIAGNOSIS — R32 Unspecified urinary incontinence: Secondary | ICD-10-CM | POA: Diagnosis not present

## 2016-04-03 DIAGNOSIS — Z09 Encounter for follow-up examination after completed treatment for conditions other than malignant neoplasm: Secondary | ICD-10-CM | POA: Diagnosis not present

## 2016-05-25 MED FILL — LO LOESTRIN FE 1-10 TABLET: 1 MG-10 MCG | 84 days supply | Qty: 84 | Fill #4

## 2016-06-25 MED FILL — VESIcare 5 MG TABS: 5 | 30 days supply | Qty: 60 | Fill #0

## 2016-07-19 ENCOUNTER — Other Ambulatory Visit: Payer: Self-pay | Admitting: Internal Medicine

## 2016-07-19 DIAGNOSIS — Z0000 Encounter for general adult medical examination without abnormal findings: Secondary | ICD-10-CM | POA: Diagnosis not present

## 2016-07-19 DIAGNOSIS — N6452 Nipple discharge: Secondary | ICD-10-CM | POA: Diagnosis not present

## 2016-07-19 DIAGNOSIS — Z23 Encounter for immunization: Secondary | ICD-10-CM | POA: Diagnosis not present

## 2016-07-21 ENCOUNTER — Other Ambulatory Visit: Payer: Self-pay | Admitting: Internal Medicine

## 2016-07-21 DIAGNOSIS — N6452 Nipple discharge: Secondary | ICD-10-CM

## 2016-07-25 ENCOUNTER — Ambulatory Visit
Admission: RE | Admit: 2016-07-25 | Discharge: 2016-07-25 | Disposition: A | Discharge status: Home / Self Care | Payer: 59 | Source: Ambulatory Visit | Attending: Internal Medicine | Admitting: Internal Medicine

## 2016-07-25 DIAGNOSIS — R922 Inconclusive mammogram: Secondary | ICD-10-CM | POA: Diagnosis not present

## 2016-07-25 DIAGNOSIS — N6452 Nipple discharge: Secondary | ICD-10-CM

## 2016-07-26 ENCOUNTER — Encounter: Payer: 59 | Attending: Internal Medicine | Admitting: Dietician

## 2016-07-26 ENCOUNTER — Encounter: Payer: Self-pay | Admitting: Dietician

## 2016-07-26 DIAGNOSIS — E669 Obesity, unspecified: Secondary | ICD-10-CM

## 2016-07-26 DIAGNOSIS — Z713 Dietary counseling and surveillance: Secondary | ICD-10-CM | POA: Diagnosis not present

## 2016-07-26 NOTE — Progress Notes (Signed)
Medical Nutrition Therapy:  Appt start time: 1330 end time:  1500.   Assessment:  Primary concerns today: obesity.  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  She desires weight loss and has good nutrition questions for me today.  She has been taking Saxenda for the past week and notes changes in her appetite.  She has taken in the past with success but once she stopped taking it she regained the weight, stating that her "mindset never changed."  Her dietary recall reveals skipping of meals and poor appetite management, describing herself as "starving" or "ravenous" before meal times.  She identifies herself to be an emotional eater, especially after a hard day at work or even when happy and celebrating an occasion or achievement.  She appears very motivated and wants to follow up with me monthly.  She has made some changes already such as cutting back on sugary beverages and watching her portion sizes.  Preferred Learning Style:  No preference indicated   Learning Readiness:  Ready  MEDICATIONS: see list   DIETARY INTAKE:  Usual eating pattern includes 2-3 meals and 0-1 snacks per day.  24-hr recall:  B (7:30-8 AM): two [place] sausage links and 1/2 cup eggs or instant oatmeal or skips Snk ( AM): usually none  L (1-2 PM): chicken salad on crossaint with 2 slices tomato and muenster cheese and individual sized chips or McDonalds burger, fries, and soda or skips Snk ( PM): usually none D (8:30-9 PM): fried chicken breast, 1/4 cup turnip greens, 2 hushpuppies, 1/4 cup potato salad, 2 glasses white blend wine Snk (12 AM): 1/2 ham sandwich Beverages: coffee, Dasani carbonated flavored waters  Usual physical activity: currently none outside of work, in the past has enjoyed walking first thing in the morning  Estimated energy needs: 1400-1500 calories 160-170 g carbohydrates 105-115 g protein <40 g fat  Progress Towards Goal(s):  In progress.   Nutritional  Diagnosis:  NB-1.1 Food and nutrition-related knowledge deficit As related to no prior formal nutrition education.  As evidenced by patient questions and request for nutrition/dietary advice to help her lose weight in a sustainable way.    Intervention:  Nutrition education and counseling. Discussed her weight loss goals and stated that our strategy is to come up with specific goals for her to make this a sustainable lifestyle change.  Recommended target weight loss of 0.5-2 lbs per week.  Discussed appetite management strategies like meal timing and mindful eating.  Discussed mindful eating and recommended identifying her hunger and fullness level before and after each meal as well as eating without distractions and eating slowly (making meals last at least 20 min long).  Discouraged skipping meals which often leads to eating more later in the day.  Discussed emotional eating and why this is so common (using food as an easily accessibly tool to boost mood/ light up pleasure/reward center of the brain) and brainstormed other ways she can deal with and work through certain emotions.  Recommended some reading for her on these topics.  Discussed her current eating habits and demonstrated appropriate portion sizes, utilizing MyPlate or the portion plate to demonstrate healthy, balanced meals.  Recommended increase in fiber intake by increasing daily servings of fruits and veggies, beans, nuts, seeds, and whole grains.  Taught Nutrition Fact Label reading and provided recommended daily values of certain nutrients.  Also recommended use of MiraLax or Metamucil as a fiber supplement to help reduce her constipation.  Recommended  increased water intake as well of at least 64 oz per day.  Answered her nutrition questions.  Together we set the following goals and will build upon these goals during future visits.  We will also discuss her readiness to begin regular exercise at our next visit and put a physical activity plan  into place.  Goals:  - Have breakfast 5-6 days per week - Check in with yourself every 3 hours and ask "how hungry am I?"  Honor your hunger to reduce "ravenous" hunger throughout the day.  During meals ask yourself halfway through "how full am I getting?" - Eat without distractions (not in the car, not in front of the TV or while multi-tasking at work) - Increase fiber and water intake (can also use the MyPlate guidelines)  Teaching Method Utilized: Visual Auditory Hands on  Handouts given during visit include:  MyPlate  Nutrition Fact Label  Low-Carb Snack Suggestions  Recommended reading: "Savvy Girl a Guide to Eating" and "Intuitive Eating" with "Intuitive Eating Workbook"  Barriers to learning/adherence to lifestyle change: none  Demonstrated degree of understanding via:  Teach Back   Monitoring/Evaluation:  Dietary intake, exercise, progress towards goals, and body weight in 1 month(s).

## 2016-08-08 MED FILL — SAXENDA 18 MG/3 ML PEN: 18 | 30 days supply | Qty: 15 | Fill #0

## 2016-08-10 MED FILL — LO LOESTRIN FE 1-10 TABLET: 1 MG-10 MCG | 84 days supply | Qty: 84 | Fill #0

## 2016-08-21 DIAGNOSIS — Z01411 Encounter for gynecological examination (general) (routine) with abnormal findings: Secondary | ICD-10-CM | POA: Diagnosis not present

## 2016-08-21 DIAGNOSIS — N920 Excessive and frequent menstruation with regular cycle: Secondary | ICD-10-CM | POA: Diagnosis not present

## 2016-08-21 DIAGNOSIS — N926 Irregular menstruation, unspecified: Secondary | ICD-10-CM | POA: Diagnosis not present

## 2016-08-30 ENCOUNTER — Encounter: Payer: Self-pay | Admitting: Dietician

## 2016-08-30 ENCOUNTER — Encounter: Payer: 59 | Attending: Internal Medicine | Admitting: Dietician

## 2016-08-30 DIAGNOSIS — E669 Obesity, unspecified: Secondary | ICD-10-CM

## 2016-08-30 DIAGNOSIS — Z713 Dietary counseling and surveillance: Secondary | ICD-10-CM | POA: Diagnosis not present

## 2016-08-30 NOTE — Patient Instructions (Signed)
Goals:  - Have breakfast 5-6 days per week - Check in with yourself every 3 hours and ask "how hungry am I?"  Honor your hunger to reduce "ravenous" hunger throughout the day.  During meals ask yourself halfway through "how full am I getting?" - Eat without distractions (not in the car, not in front of the TV or while multi-tasking at work) - Increase fiber and water intake (can also use the MyPlate guidelines) - Consider starting a physical activity routine.  Try the C25K interval app.  SworkIt is another good exercise app.

## 2016-08-30 NOTE — Progress Notes (Signed)
Medical Nutrition Therapy:  Appt start time: 1400 end time:  1430.   Assessment:  Primary concerns today: follow-up for obesity.  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  She has made very good progress towards her goals since our last visit and is approximately 13 lbs down from her weight at our last visit 5 weeks ago.  This is an excellent rate of weight loss.  Patient states she often is able to lose weight easily and she is more worried for keeping the weight off long-term without regaining, especially once she goes off of the weight loss pill. Her husband has preDM and this has effected their food intake choices as a family.  She also makes statements today about her 38 year old daughter and helping her to create healthy habits around food and weight.   Usual physical activity: currently none outside of work, in the past has enjoyed walking first thing in the morning.  Is also interested in HIIT for weight loss.  Recommended a few apps she can try: C25K and SworkIt.  Assess her to still be in the contemplation stage of beginning regular physical activity.  Estimated energy needs: 1400-1500 calories 160-170 g carbohydrates 105-115 g protein <40 g fat  Progress Towards Goal(s):  In progress.   Nutritional Diagnosis:  NB-1.1 Food and nutrition-related knowledge deficit As related to no prior formal nutrition education.  As evidenced by patient questions and request for nutrition/dietary advice to help her lose weight in a sustainable way.    Intervention:  Nutrition education and counseling. Assessed her needs and interests for today.  She states that she would like some tools for dealing with emotional food cravings when bored or stressed.  RD to email additional materials.  Reviewed and discussed progress on her set goals from last visit.  Praised her for the changes she has made and her progress so far.  Addressed her fears and concerns about weight maintenance  and discussed strategies we are putting in place to help develop habits that she can sustain over time.  Discussed family nutrition and briefly introduced [employer] division of responsibility.  Progress towards goals: struggling with breakfast goal, having something to eat 3-4 days a week.  Has noticed that coffee in the morning suppressing her appetite.  Has been trying to use smaller plates and notes that it helps her portion control and satisfaction level.  Is checking in with her hunger as instructed.  Has noticed biggest influence to eat outside of hunger has been stress.  States mood has also been decreased recently with passing of a family member and work Barista.  She states she still eats with distractions most of the time and would like to focus more on that this month.  Recommended she choose one specific area to focus on (making a no-TV rule for family meals, limit eating in the car in the morning, etc.)  She states she is meeting her water goals easily but still struggling with fiber.  Discussed high fiber foods today and recommended she have beans (which she loves) 2-3x/week.  Discussed physical activity.  Answered her nutrition questions.  Continued Goals:  - Have breakfast 5-6 days per week - Check in with yourself every 3 hours and ask "how hungry am I?"  Honor your hunger to reduce "ravenous" hunger throughout the day.  During meals ask yourself halfway through "how full am I getting?" - Eat without distractions (not in the car, not in  front of the TV or while multi-tasking at work) - Increase fiber and water intake (can also use the MyPlate guidelines) New Goal: - Consider starting a physical activity routine.  Try the C25K interval app.  SworkIt is another good exercise app.  Barriers to learning/adherence to lifestyle change: none  Demonstrated degree of understanding via:  Teach Back   Monitoring/Evaluation:  Dietary intake, exercise, progress towards goals, and body  weight in 1 month(s).

## 2016-09-06 DIAGNOSIS — N920 Excessive and frequent menstruation with regular cycle: Secondary | ICD-10-CM | POA: Diagnosis not present

## 2016-09-06 DIAGNOSIS — N921 Excessive and frequent menstruation with irregular cycle: Secondary | ICD-10-CM | POA: Diagnosis not present

## 2016-09-06 DIAGNOSIS — Z01411 Encounter for gynecological examination (general) (routine) with abnormal findings: Secondary | ICD-10-CM | POA: Diagnosis not present

## 2016-09-06 DIAGNOSIS — D259 Leiomyoma of uterus, unspecified: Secondary | ICD-10-CM | POA: Diagnosis not present

## 2016-09-11 ENCOUNTER — Encounter (HOSPITAL_COMMUNITY): Payer: Self-pay | Admitting: Emergency Medicine

## 2016-09-11 ENCOUNTER — Other Ambulatory Visit: Payer: Self-pay | Admitting: Obstetrics and Gynecology

## 2016-09-12 NOTE — Patient Instructions (Signed)
Your procedure is scheduled on:  Wednesday, Oct. 4, 2017  Enter through the [employer] of [facility] at:  11:45 AM  Pick up the phone at the desk and dial [phone].  Call this number if you have problems the morning of surgery: [phone].  Remember: Do NOT eat food:  After Midnight Tuesday  Do NOT drink clear liquids after:  9:00 AM day of surgery  Take these medicines the morning of surgery with a SIP OF WATER:  None  Do NOT wear jewelry (body piercing), metal hair clips/bobby pins, make-up, or nail polish. Do NOT wear lotions, powders, or perfumes.  You may wear deodorant. Do NOT shave for 48 hours prior to surgery. Do NOT bring valuables to the hospital. Contacts, dentures, or bridgework may not be worn into surgery.  Leave suitcase in car.  After surgery it may be brought to your room.  For patients admitted to the hospital, checkout time is 11:00 AM the day of discharge.

## 2016-09-13 ENCOUNTER — Encounter (HOSPITAL_COMMUNITY): Payer: Self-pay

## 2016-09-13 ENCOUNTER — Encounter (HOSPITAL_COMMUNITY)
Admission: RE | Admit: 2016-09-13 | Discharge: 2016-09-13 | Disposition: A | Discharge status: Home / Self Care | Payer: 59 | Source: Ambulatory Visit | Attending: Obstetrics and Gynecology | Admitting: Obstetrics and Gynecology

## 2016-09-13 DIAGNOSIS — Z01812 Encounter for preprocedural laboratory examination: Secondary | ICD-10-CM | POA: Insufficient documentation

## 2016-09-13 DIAGNOSIS — D259 Leiomyoma of uterus, unspecified: Secondary | ICD-10-CM | POA: Insufficient documentation

## 2016-09-13 HISTORY — DX: Sickle-cell trait: D57.3

## 2016-09-13 HISTORY — DX: Constipation, unspecified: K59.00

## 2016-09-13 LAB — BASIC METABOLIC PANEL
Anion gap: 6 (ref 5–15)
BUN: 11 mg/dL (ref 6–20)
CALCIUM: 9 mg/dL (ref 8.9–10.3)
CO2: 26 mmol/L (ref 22–32)
CREATININE: 0.88 mg/dL (ref 0.44–1.00)
Chloride: 105 mmol/L (ref 101–111)
GFR calc Af Amer: >60 mL/min (ref >60)
Glucose, Bld: 80 mg/dL (ref 65–99)
Potassium: 3.5 mmol/L (ref 3.5–5.1)
SODIUM: 137 mmol/L (ref 135–145)

## 2016-09-13 LAB — CBC
HEMATOCRIT: 34.9 % — AB (ref 36.0–46.0)
HEMOGLOBIN: 12.1 g/dL (ref 12.0–15.0)
MCH: 28.5 pg (ref 26.0–34.0)
MCHC: 34.7 g/dL (ref 30.0–36.0)
MCV: 82.3 fL (ref 78.0–100.0)
Platelets: 282 10*3/uL (ref 150–400)
RBC: 4.24 MIL/uL (ref 3.87–5.11)
RDW: 13.8 % (ref 11.5–15.5)
WBC: 4.3 10*3/uL (ref 4.0–10.5)

## 2016-09-13 MED FILL — SAXENDA 18 MG/3 ML PEN: 18 | 30 days supply | Qty: 15 | Fill #1

## 2016-09-13 NOTE — H&P (Signed)
[name] is a 40 y.o.  female P: 3-0-2-3 presents for hysterectomy because of  irregular vaginal bleeding and symptomatic uterine fibroids.  Over the past 3 months the patient has experienced bleeding every 2 weeks that would last from 7-11 days.  On occasion she would solid her clothing due to clots but otherwise changed her pad every 3 hours.  Fortunately her cramping was minimal and though birth control pills were given for to regulate her bleeding, they were not effective.   An endometrial biopsy in September 2017 returned benign inactive endometrium and a pelvic ultrasound:  uterus:  10.82 x 5.64 x 6.57 cm, endometrium with normal morphology but irregular; #6 fibroids: Intramural: left lateral 1.83 cm, left posterior 1.78 cm, fundal 2.30 cm & 2.52 cm , right 2.18 cm and posterior 1.78 cm;  right ovary-3.20 cm and left ovary-4.57 cm with a simple 3/1 cm cyst.  A review of both medical and surgical management options were given to the patient however she wishes to proceed with definitive therapy in the form of hysterectomy.   Past Medical History  OB History: G: 4;  P: 3-1-1-3     SVB 2005  and  2011  (twins)  GYN History: menarche: 40 YO    LMP: 09/08/16    Contracepton oral contraceptives (estrogen/progesterone)  The patient denies history of sexually transmitted disease.  Denies history of abnormal PAP smear.   Last PAP smear: March 2017  Medical History: Stress Urinary Incontinence  Surgical History:  2010   D & C for endometrial polyps;    Dental Surgery and Cerclage Placement Denies problems with anesthesia or history of blood transfusions  Family History: Hypertension, Hypercholesterolemia, Breast Cancer, Rectal Cancer, Prostate Cancer, Congestive Heart Failure and Diabetes Mellitus  Social History: Married and employed as an Therapist, sports;   Denies tobacco use but occasionally uses alchohol   Medications:  Vesicare 5 mg daily Lo Loestrin FE daily  Denies sensitivity to peanuts,  shellfish, soy or  latex.   No Known Allergies      Adhesives cause a rash  ROS: Admits to  dental braces, urinary stress incontinence, chronic post nasal drainage, constipation and right knee swelling but  denies headache, vision changes, dysphagia, tinnitus, dizziness, hoarseness, cough,  chest pain, shortness of breath, nausea, vomiting, diarrhea, urinary frequency, urgency  dysuria, hematuria, vaginitis symptoms, pelvic pain, swelling of joints,easy bruising,  myalgias, arthralgias, skin rashes, unexplained weight loss and except as is mentioned in the history of present illness, patient's review of systems is otherwise negative.   Physical Exam  Bp: 106/68     P: 76  R: 18  Weight: 214 lbs.  Height: 5\' 5"     BMI  35.1  Neck: supple without masses or thyromegaly Lungs: clear to auscultation Heart: regular rate and rhythm Abdomen: soft, non-tender and no organomegaly Pelvic:EGBUS- wnl; vagina-normal rugae; uterus-12-14 weeks  size (exam limited by habitus) , cervix without lesions or motion tenderness; adnexae-no tenderness or masses Extremities:  no clubbing, cyanosis or edema   Assesment:  Irregular Bleeding            Uterine Fibroids   Disposition:  A discussion was held with patient regarding the indication for her procedure(s) along with the risks, which include but are not limited to: reaction to anesthesia, damage to adjacent organs, infection and excessive bleeding.  The patient verbalized understanding of these risks and has consented to proceed with a Laparoscopically Assisted Total Vaginal Hysterectomy, Bilateral Salpingectomy, Possible Ovarian Cystectomy  and Possible Cystoscopy at [place] on September 19, 2016.  CSN# [id number]   [place] J. Florene Glen, PA-C  for Dr. Harvie Bridge. Mancel Bale

## 2016-09-18 MED FILL — UNIFINE PENTIPS 32GX5/32: 32G X 4 MM | 90 days supply | Qty: 100 | Fill #0

## 2016-09-18 MED FILL — UNIFINE PENTIPS 32GX5/32": 32G X 4 MM | 90 days supply | Qty: 100 | Fill #0

## 2016-09-19 ENCOUNTER — Ambulatory Visit (HOSPITAL_COMMUNITY): Payer: 59 | Admitting: Anesthesiology

## 2016-09-19 ENCOUNTER — Ambulatory Visit (HOSPITAL_COMMUNITY)
Admission: RE | Admit: 2016-09-19 | Discharge: 2016-09-20 | Disposition: A | Discharge status: Home / Self Care | Payer: 59 | Source: Ambulatory Visit | Attending: Obstetrics and Gynecology | Admitting: Obstetrics and Gynecology

## 2016-09-19 ENCOUNTER — Encounter (HOSPITAL_COMMUNITY): Payer: Self-pay | Admitting: *Deleted

## 2016-09-19 ENCOUNTER — Encounter (HOSPITAL_COMMUNITY)
Admission: RE | Disposition: A | Discharge status: Home / Self Care | Payer: Self-pay | Source: Ambulatory Visit | Attending: Obstetrics and Gynecology

## 2016-09-19 DIAGNOSIS — Z90710 Acquired absence of both cervix and uterus: Secondary | ICD-10-CM | POA: Diagnosis present

## 2016-09-19 DIAGNOSIS — D251 Intramural leiomyoma of uterus: Secondary | ICD-10-CM | POA: Diagnosis not present

## 2016-09-19 DIAGNOSIS — D271 Benign neoplasm of left ovary: Secondary | ICD-10-CM | POA: Insufficient documentation

## 2016-09-19 DIAGNOSIS — N920 Excessive and frequent menstruation with regular cycle: Secondary | ICD-10-CM | POA: Insufficient documentation

## 2016-09-19 DIAGNOSIS — D259 Leiomyoma of uterus, unspecified: Secondary | ICD-10-CM | POA: Diagnosis not present

## 2016-09-19 DIAGNOSIS — N8302 Follicular cyst of left ovary: Secondary | ICD-10-CM | POA: Diagnosis not present

## 2016-09-19 DIAGNOSIS — D250 Submucous leiomyoma of uterus: Secondary | ICD-10-CM | POA: Diagnosis not present

## 2016-09-19 DIAGNOSIS — N393 Stress incontinence (female) (male): Secondary | ICD-10-CM | POA: Diagnosis not present

## 2016-09-19 DIAGNOSIS — N939 Abnormal uterine and vaginal bleeding, unspecified: Secondary | ICD-10-CM | POA: Diagnosis not present

## 2016-09-19 DIAGNOSIS — N83202 Unspecified ovarian cyst, left side: Secondary | ICD-10-CM | POA: Diagnosis not present

## 2016-09-19 HISTORY — PX: CYSTOSCOPY: SHX5120

## 2016-09-19 HISTORY — PX: LAPAROSCOPIC ASSISTED VAGINAL HYSTERECTOMY: SHX5398

## 2016-09-19 LAB — ABO/RH: ABO/RH(D): B POS

## 2016-09-19 LAB — PREGNANCY, URINE: PREG TEST UR: NEGATIVE

## 2016-09-19 LAB — TYPE AND SCREEN
ABO/RH(D): B POS
ANTIBODY SCREEN: NEGATIVE

## 2016-09-19 SURGERY — HYSTERECTOMY, VAGINAL, LAPAROSCOPY-ASSISTED
Anesthesia: General | Site: Urethra

## 2016-09-19 MED ORDER — SUGAMMADEX SODIUM 200 MG/2ML IV SOLN
INTRAVENOUS | Status: AC
  Filled 2016-09-19: qty 2

## 2016-09-19 MED ORDER — FENTANYL CITRATE (PF) 250 MCG/5ML IJ SOLN
INTRAMUSCULAR | Status: AC
  Filled 2016-09-19: qty 5

## 2016-09-19 MED ORDER — KETOROLAC TROMETHAMINE 30 MG/ML IJ SOLN: 30.0000 mg | Freq: Four times a day (QID) | INTRAMUSCULAR | Status: DC

## 2016-09-19 MED ORDER — KETOROLAC TROMETHAMINE 30 MG/ML IJ SOLN
30.0000 mg | Freq: Once | INTRAMUSCULAR | Status: AC
  Administered 2016-09-19: 30 mg via INTRAVENOUS

## 2016-09-19 MED ORDER — PROPOFOL 10 MG/ML IV BOLUS
INTRAVENOUS | Status: AC
  Filled 2016-09-19: qty 20

## 2016-09-19 MED ORDER — MIDAZOLAM HCL 2 MG/2ML IJ SOLN
INTRAMUSCULAR | Status: AC
  Filled 2016-09-19: qty 2

## 2016-09-19 MED ORDER — ONDANSETRON HCL 4 MG/2ML IJ SOLN
INTRAMUSCULAR | Status: AC
  Filled 2016-09-19: qty 2

## 2016-09-19 MED ORDER — HYDROMORPHONE HCL 1 MG/ML IJ SOLN
INTRAMUSCULAR | Status: AC
  Filled 2016-09-19: qty 1

## 2016-09-19 MED ORDER — SODIUM CHLORIDE 0.9% FLUSH: 9.0000 mL | INTRAVENOUS | Status: DC | PRN

## 2016-09-19 MED ORDER — DEXAMETHASONE SODIUM PHOSPHATE 10 MG/ML IJ SOLN
INTRAMUSCULAR | Status: AC
  Filled 2016-09-19: qty 1

## 2016-09-19 MED ORDER — LIDOCAINE HCL (CARDIAC) 20 MG/ML IV SOLN
INTRAVENOUS | Status: AC
  Filled 2016-09-19: qty 5

## 2016-09-19 MED ORDER — DIPHENHYDRAMINE HCL 50 MG/ML IJ SOLN
12.5000 mg | Freq: Once | INTRAMUSCULAR | Status: AC
  Administered 2016-09-19: 12.5 mg via INTRAVENOUS

## 2016-09-19 MED ORDER — PHENYLEPHRINE HCL 10 MG/ML IJ SOLN
INTRAMUSCULAR | Status: DC | PRN
  Administered 2016-09-19: 40 ug via INTRAVENOUS
  Administered 2016-09-19 (×2): 80 ug via INTRAVENOUS

## 2016-09-19 MED ORDER — SCOPOLAMINE 1 MG/3DAYS TD PT72
1.0000 | MEDICATED_PATCH | Freq: Once | TRANSDERMAL | Status: DC
  Administered 2016-09-19: 1.5 mg via TRANSDERMAL

## 2016-09-19 MED ORDER — ONDANSETRON HCL 4 MG/2ML IJ SOLN: 4.0000 mg | Freq: Four times a day (QID) | INTRAMUSCULAR | Status: DC | PRN

## 2016-09-19 MED ORDER — SCOPOLAMINE 1 MG/3DAYS TD PT72
MEDICATED_PATCH | TRANSDERMAL | Status: AC
  Administered 2016-09-19: 1.5 mg via TRANSDERMAL
  Filled 2016-09-19: qty 1

## 2016-09-19 MED ORDER — MIDAZOLAM HCL 2 MG/2ML IJ SOLN
INTRAMUSCULAR | Status: DC | PRN
  Administered 2016-09-19: 2 mg via INTRAVENOUS

## 2016-09-19 MED ORDER — GLYCOPYRROLATE 0.2 MG/ML IJ SOLN
INTRAMUSCULAR | Status: AC
  Filled 2016-09-19: qty 1

## 2016-09-19 MED ORDER — ESTRADIOL 0.1 MG/GM VA CREA
TOPICAL_CREAM | VAGINAL | Status: DC | PRN
  Administered 2016-09-19: 1 via VAGINAL

## 2016-09-19 MED ORDER — NALOXONE HCL 0.4 MG/ML IJ SOLN: 0.4000 mg | INTRAMUSCULAR | Status: DC | PRN

## 2016-09-19 MED ORDER — DIPHENHYDRAMINE HCL 12.5 MG/5ML PO ELIX
12.5000 mg | ORAL_SOLUTION | Freq: Four times a day (QID) | ORAL | Status: DC | PRN
  Administered 2016-09-19 – 2016-09-20 (×2): 12.5 mg via ORAL
  Filled 2016-09-19 (×2): qty 5

## 2016-09-19 MED ORDER — ACETAMINOPHEN 10 MG/ML IV SOLN
1000.0000 mg | Freq: Once | INTRAVENOUS | Status: AC
  Administered 2016-09-19: 1000 mg via INTRAVENOUS
  Filled 2016-09-19: qty 100

## 2016-09-19 MED ORDER — LACTATED RINGERS IV SOLN
INTRAVENOUS | Status: DC
  Administered 2016-09-19 (×2): via INTRAVENOUS

## 2016-09-19 MED ORDER — LACTATED RINGERS IV SOLN
INTRAVENOUS | Status: DC
  Administered 2016-09-19 (×3): via INTRAVENOUS

## 2016-09-19 MED ORDER — SODIUM CHLORIDE 0.9 % IJ SOLN
INTRAMUSCULAR | Status: DC | PRN
  Administered 2016-09-19: 17 mL via INTRAVENOUS

## 2016-09-19 MED ORDER — MEPERIDINE HCL 25 MG/ML IJ SOLN: 6.2500 mg | INTRAMUSCULAR | Status: DC | PRN

## 2016-09-19 MED ORDER — ROCURONIUM BROMIDE 100 MG/10ML IV SOLN
INTRAVENOUS | Status: AC
  Filled 2016-09-19: qty 1

## 2016-09-19 MED ORDER — HYDROMORPHONE 1 MG/ML IV SOLN
INTRAVENOUS | Status: DC
Start: 2016-09-19 — End: 2016-09-20
  Administered 2016-09-19: 0.6 mg via INTRAVENOUS
  Administered 2016-09-19: 20:00:00 via INTRAVENOUS
  Administered 2016-09-20: 1.2 mg via INTRAVENOUS
  Administered 2016-09-20: 1.6 mg via INTRAVENOUS
  Filled 2016-09-19: qty 25

## 2016-09-19 MED ORDER — DIPHENHYDRAMINE HCL 50 MG/ML IJ SOLN: 12.5000 mg | Freq: Four times a day (QID) | INTRAMUSCULAR | Status: DC | PRN

## 2016-09-19 MED ORDER — DEXAMETHASONE SODIUM PHOSPHATE 10 MG/ML IJ SOLN
INTRAMUSCULAR | Status: DC | PRN
  Administered 2016-09-19: 4 mg via INTRAVENOUS

## 2016-09-19 MED ORDER — CEFAZOLIN SODIUM-DEXTROSE 2-4 GM/100ML-% IV SOLN
2.0000 g | INTRAVENOUS | Status: AC
  Administered 2016-09-19: 2 g via INTRAVENOUS

## 2016-09-19 MED ORDER — SUGAMMADEX SODIUM 200 MG/2ML IV SOLN
INTRAVENOUS | Status: DC | PRN
  Administered 2016-09-19: 200 mg via INTRAVENOUS

## 2016-09-19 MED ORDER — IBUPROFEN 600 MG PO TABS: 600.0000 mg | ORAL_TABLET | Freq: Four times a day (QID) | ORAL | Status: DC | PRN

## 2016-09-19 MED ORDER — LIDOCAINE HCL (CARDIAC) 20 MG/ML IV SOLN
INTRAVENOUS | Status: DC | PRN
  Administered 2016-09-19: 50 mg via INTRAVENOUS

## 2016-09-19 MED ORDER — ONDANSETRON HCL 4 MG/2ML IJ SOLN
INTRAMUSCULAR | Status: DC | PRN
  Administered 2016-09-19: 4 mg via INTRAVENOUS

## 2016-09-19 MED ORDER — BUPIVACAINE HCL (PF) 0.25 % IJ SOLN
INTRAMUSCULAR | Status: DC | PRN
  Administered 2016-09-19: 14 mL

## 2016-09-19 MED ORDER — PROMETHAZINE HCL 25 MG/ML IJ SOLN: 6.2500 mg | INTRAMUSCULAR | Status: DC | PRN

## 2016-09-19 MED ORDER — OXYCODONE-ACETAMINOPHEN 5-325 MG PO TABS
1.0000 | ORAL_TABLET | ORAL | Status: DC | PRN
  Administered 2016-09-20: 2 via ORAL
  Administered 2016-09-20 (×2): 1 via ORAL
  Filled 2016-09-19: qty 2
  Filled 2016-09-19 (×2): qty 1

## 2016-09-19 MED ORDER — PROPOFOL 10 MG/ML IV BOLUS
INTRAVENOUS | Status: DC | PRN
  Administered 2016-09-19: 200 mg via INTRAVENOUS

## 2016-09-19 MED ORDER — VASOPRESSIN 20 UNIT/ML IV SOLN
INTRAVENOUS | Status: DC | PRN
  Administered 2016-09-19: 17 mL via INTRAMUSCULAR

## 2016-09-19 MED ORDER — ROCURONIUM BROMIDE 100 MG/10ML IV SOLN
INTRAVENOUS | Status: DC | PRN
  Administered 2016-09-19: 50 mg via INTRAVENOUS
  Administered 2016-09-19: 10 mg via INTRAVENOUS

## 2016-09-19 MED ORDER — HYDROMORPHONE HCL 1 MG/ML IJ SOLN
INTRAMUSCULAR | Status: DC | PRN
  Administered 2016-09-19 (×2): 1 mg via INTRAVENOUS

## 2016-09-19 MED ORDER — HYDROMORPHONE HCL 1 MG/ML IJ SOLN
0.2500 mg | INTRAMUSCULAR | Status: DC | PRN
  Administered 2016-09-19 (×2): 0.5 mg via INTRAVENOUS
  Administered 2016-09-19 (×2): 0.25 mg via INTRAVENOUS

## 2016-09-19 MED ORDER — FENTANYL CITRATE (PF) 100 MCG/2ML IJ SOLN
INTRAMUSCULAR | Status: DC | PRN
  Administered 2016-09-19 (×2): 100 ug via INTRAVENOUS
  Administered 2016-09-19: 150 ug via INTRAVENOUS
  Administered 2016-09-19: 100 ug via INTRAVENOUS
  Administered 2016-09-19: 50 ug via INTRAVENOUS

## 2016-09-19 SURGICAL SUPPLY — 60 items
BARRIER ADHS 3X4 INTERCEED (GAUZE/BANDAGES/DRESSINGS) IMPLANT
BRR ADH 4X3 ABS CNTRL BYND (GAUZE/BANDAGES/DRESSINGS)
CABLE HIGH FREQUENCY MONO STRZ (ELECTRODE) IMPLANT
CATH ROBINSON RED A/P 16FR (CATHETERS) IMPLANT
CLOTH BEACON ORANGE TIMEOUT ST (SAFETY) ×3 IMPLANT
CONT PATH 16OZ SNAP LID 3702 (MISCELLANEOUS) ×3 IMPLANT
COVER BACK TABLE 60X90IN (DRAPES) ×3 IMPLANT
DECANTER SPIKE VIAL GLASS SM (MISCELLANEOUS) ×3 IMPLANT
DRAPE SHEET LG 3/4 BI-LAMINATE (DRAPES) ×3 IMPLANT
DRSG OPSITE POSTOP 3X4 (GAUZE/BANDAGES/DRESSINGS) ×3 IMPLANT
DURAPREP 26ML APPLICATOR (WOUND CARE) ×3 IMPLANT
ELECT REM PT RETURN 9FT ADLT (ELECTROSURGICAL) ×3
ELECTRODE REM PT RTRN 9FT ADLT (ELECTROSURGICAL) ×2 IMPLANT
FILTER SMOKE EVAC LAPAROSHD (FILTER) ×3 IMPLANT
FORCEPS CUTTING 33CM 5MM (CUTTING FORCEPS) ×6 IMPLANT
GAUZE PACKING 1 X5 YD ST (GAUZE/BANDAGES/DRESSINGS) ×3 IMPLANT
GAUZE PACKING 2X5 YD STRL (GAUZE/BANDAGES/DRESSINGS) ×3 IMPLANT
GLOVE BIO SURGEON STRL SZ7.5 (GLOVE) ×3 IMPLANT
GLOVE BIOGEL PI IND STRL 7.0 (GLOVE) ×8 IMPLANT
GLOVE BIOGEL PI IND STRL 7.5 (GLOVE) ×4 IMPLANT
GLOVE BIOGEL PI INDICATOR 7.0 (GLOVE) ×4
GLOVE BIOGEL PI INDICATOR 7.5 (GLOVE) ×2
HEMOSTAT SURGICEL 2X3 (HEMOSTASIS) ×3 IMPLANT
HEMOSTAT SURGICEL 4X8 (HEMOSTASIS) IMPLANT
LEGGING LITHOTOMY PAIR STRL (DRAPES) ×3 IMPLANT
LIQUID BAND (GAUZE/BANDAGES/DRESSINGS) ×3 IMPLANT
NEEDLE INSUFFLATION 120MM (ENDOMECHANICALS) ×3 IMPLANT
NEEDLE MAYO CATGUT SZ4 (NEEDLE) ×3 IMPLANT
NS IRRIG 1000ML POUR BTL (IV SOLUTION) ×3 IMPLANT
PACK LAVH (CUSTOM PROCEDURE TRAY) ×3 IMPLANT
PACK ROBOTIC GOWN (GOWN DISPOSABLE) ×3 IMPLANT
PACK TRENDGUARD 450 HYBRID PRO (MISCELLANEOUS) ×2 IMPLANT
PACK TRENDGUARD 600 HYBRD PROC (MISCELLANEOUS) ×2 IMPLANT
PROTECTOR NERVE ULNAR (MISCELLANEOUS) ×6 IMPLANT
SCISSORS LAP 5X35 DISP (ENDOMECHANICALS) IMPLANT
SET CYSTO W/LG BORE CLAMP LF (SET/KITS/TRAYS/PACK) ×3 IMPLANT
SET IRRIG TUBING LAPAROSCOPIC (IRRIGATION / IRRIGATOR) IMPLANT
SHEARS HARMONIC ACE PLUS 36CM (ENDOMECHANICALS) IMPLANT
SLEEVE XCEL OPT CAN 5 100 (ENDOMECHANICALS) ×3 IMPLANT
SOLUTION ELECTROLUBE (MISCELLANEOUS) IMPLANT
SUT CHROMIC 2 0 SH (SUTURE) IMPLANT
SUT MON AB 4-0 PS1 27 (SUTURE) ×3 IMPLANT
SUT VIC AB 0 CT1 18XCR BRD8 (SUTURE) ×4 IMPLANT
SUT VIC AB 0 CT1 27 (SUTURE) ×3
SUT VIC AB 0 CT1 27XBRD ANBCTR (SUTURE) ×2 IMPLANT
SUT VIC AB 0 CT1 36 (SUTURE) ×3 IMPLANT
SUT VIC AB 0 CT1 8-18 (SUTURE) ×6
SUT VICRYL 0 TIES 12 18 (SUTURE) ×3 IMPLANT
SUT VICRYL 0 UR6 27IN ABS (SUTURE) IMPLANT
SYR 50ML LL SCALE MARK (SYRINGE) IMPLANT
SYR BULB IRRIGATION 50ML (SYRINGE) ×3 IMPLANT
SYR TB 1ML LUER SLIP (SYRINGE) IMPLANT
TOWEL OR 17X24 6PK STRL BLUE (TOWEL DISPOSABLE) ×6 IMPLANT
TRAY FOLEY CATH SILVER 14FR (SET/KITS/TRAYS/PACK) ×3 IMPLANT
TRENDGUARD 450 HYBRID PRO PACK (MISCELLANEOUS) ×3
TRENDGUARD 600 HYBRID PROC PK (MISCELLANEOUS) ×3
TROCAR BALLN 12MMX100 BLUNT (TROCAR) IMPLANT
TROCAR XCEL NON-BLD 11X100MML (ENDOMECHANICALS) ×3 IMPLANT
TROCAR XCEL NON-BLD 5MMX100MML (ENDOMECHANICALS) ×3 IMPLANT
WARMER LAPAROSCOPE (MISCELLANEOUS) ×3 IMPLANT

## 2016-09-19 NOTE — Op Note (Addendum)
Preoperative Diagnosis: 1.Symptomatic Fibroids 2.Left Ovarian Cyst  Postoperative Diagnosis: 1.Symptomatic Fibroids 2.Left Ovarian Cyst  Procedure: 1.Laparoscopy Assisted Vaginal Hysterectomy 2.Bilateral Salpingectomy 3.Left Ovarian Cystectomy 4.Cystoscopy  Surgeon: Dr. Everett Graff  Assistant: Earnstine Regal, PA-C  Anesthetic: General   IVF: 2500 cc  EBL: 250cc   UOP: 260cc   Specimens: 1) Uterus and Cervix 2)Bilateral Fallopian Tubes 3.Cyst Wall  Findings: Fibroid uterus, nl right ovary, small left ovarian cyst with otherwise nl left ovary.  Normal appearing bilateral fallopian tubes  Procedure: The patient was taken to the operating room where general anesthesia was found to be adequate. Time out was performed.  The patient was prepped and draped in the usual sterile fashion in the dorsal lithotomy position.  A Foley catheter was placed in the bladder. A Hulka tenaculum was placed inside the uterus. Gown and gloves were changed and attention was turned to the abdomen. An incision was made in the infraumbilical area and the Veress needle was inserted into the abdominal cavity without difficulty.  A pneumoperitoneum was obtained. The laparoscopic trocar and the laparoscope were placed under direct visualization. Two additional 36mm incisions were placed in the left lower quadrant and suprapubic area. Each area was injected with 0.25 percent Marcaine.  5 mm trocars were inserted into the abdominal cavity under direct visualization. An abdominal scan was performed with the findings noted above.  Attention was turned to the right adnexa and the ureter was idenitifed. The utero-ovarian ligament on the right was cauterized and cut until separated from the uterus after excising the rt fallopian tube with the gyrus.  The same was done on the contralateral side down through the roud ligaments bilaterally.  Attention was the turned to the vagina.  The Hulka was removed and a weighted speculum was  placed in the posterior vagina. The cervix was injected with pitressin (20u in 50cc NS). The cerix was then circumferentially incised with the bovie and the bladder was dissected off the pubovesical cervical fascia. The anterior cul-de-sac was entered sharply. The same procedure was performed posteriorly and the posterior cu-lde-sac was entered sharply without difficulty. A heany clamp was placed over the uterosacral ligaments bilaterally. These were transected and suture ligated with 0 vicryl. The cardinal ligaments were then clamped bilaterally and transected and suture ligated in a similar fashion. The uterine arteries and broad ligament was then serially clamped with heany clamps, transected and suture ligated bilaterally to the fundus until the uterus and cervix was able to be removed and sent to pathology.  Hemostasis was confirmed. The cuff was repaired with 0 vicryl via interrupted stitches after placing a culdoplasty stitch which was ligated after closing the cuff.  Hemostasis was assured.  Cystoscopy was then performed in a sterile fashion and bilateral ureters were noted to efflux without difficulty.  Attention was turned to the abdomen again after regowning and gloving.  The pneumoperitoneum was reestablished. The pelvis was irrigated and inspected, slight oozing was noted in the posterior cul-de-sac near the cuff, but no active bleeding was identified. Surgicel was placed and hemostasis noted. The 5 mm trochars were removed under direct visualization. The pneumoperitoneum was allowed to escape. The infraumbilical trocar was removed. Liquiband was placed over the three trocar sites after repairing the umbilical incision with 3-0 monocryl via a subcuticular stitch. The patient tolerated her procedure well. Sponge, lap, needle, and instrument counts were correct. The patient was taken to the recovery room in stable condition.

## 2016-09-19 NOTE — Anesthesia Procedure Notes (Signed)
Procedure Name: Intubation Date/Time: 09/19/2016 1:28 PM Performed by: Jonna Munro Pre-anesthesia Checklist: Suction available, Emergency Drugs available, Timeout performed, Patient identified and Patient being monitored Patient Re-evaluated:Patient Re-evaluated prior to inductionOxygen Delivery Method: Circle system utilized Preoxygenation: Pre-oxygenation with 100% oxygen Intubation Type: IV induction Ventilation: Mask ventilation without difficulty Laryngoscope Size: Mac and 3 Grade View: Grade I Tube type: Oral Tube size: 7.0 mm Number of attempts: 1 Airway Equipment and Method: Stylet Placement Confirmation: ETT inserted through vocal cords under direct vision,  positive ETCO2 and breath sounds checked- equal and bilateral Secured at: 22 cm Tube secured with: Tape Dental Injury: Teeth and Oropharynx as per pre-operative assessment

## 2016-09-19 NOTE — Interval H&P Note (Signed)
History and Physical Interval Note:  09/19/2016 12:45 PM  [name]  has presented today for surgery, with the diagnosis of Uterine Fibroids, Menorrhagia  The various methods of treatment have been discussed with the patient and family. After consideration of risks, benefits and other options for treatment, the patient has consented to  Procedure(s): LAPAROSCOPIC ASSISTED VAGINAL HYSTERECTOMY Bilateral Sapingectomy (N/A) Possible left ovarian cystectomy as a surgical intervention .  The patient's history has been reviewed, patient examined, no change in status, stable for surgery.  I have reviewed the patient's chart and labs.  Questions were answered to the patient's satisfaction.     Delice Lesch

## 2016-09-19 NOTE — Progress Notes (Signed)
Per MD, hold patient vaginal tonight and d/c it in the am with the foley.

## 2016-09-19 NOTE — Transfer of Care (Signed)
Immediate Anesthesia Transfer of Care Note  Patient: [name]  Procedure(s) Performed: Procedure(s): LAPAROSCOPIC ASSISTED VAGINAL HYSTERECTOMY Bilateral Sapingectomy (N/A) CYSTOSCOPY  Patient Location: PACU  Anesthesia Type:General  Level of Consciousness: awake, alert  and oriented  Airway & Oxygen Therapy: Patient Spontanous Breathing and Patient connected to nasal cannula oxygen  Post-op Assessment: Report given to RN and Post -op Vital signs reviewed and stable  Post vital signs: Reviewed and stable  Last Vitals:  Vitals:   09/19/16 1216  BP: 121/84  Pulse: 90  Resp: 18  Temp: 36.9 C    Last Pain:  Vitals:   09/19/16 1216  TempSrc: Oral      Patients Stated Pain Goal: 3 ([id number])  Complications: No apparent anesthesia complications

## 2016-09-19 NOTE — Anesthesia Preprocedure Evaluation (Signed)
Anesthesia Evaluation  Patient identified by MRN, date of birth, ID band Patient awake    Reviewed: Allergy & Precautions, NPO status , Patient's Chart, lab work & pertinent test results  Airway Mallampati: II  TM Distance: >3 FB Neck ROM: Full    Dental no notable dental hx.    Pulmonary neg pulmonary ROS,    Pulmonary exam normal breath sounds clear to auscultation       Cardiovascular negative cardio ROS Normal cardiovascular exam Rhythm:Regular Rate:Normal     Neuro/Psych negative neurological ROS  negative psych ROS   GI/Hepatic negative GI ROS, Neg liver ROS,   Endo/Other  negative endocrine ROS  Renal/GU negative Renal ROS     Musculoskeletal negative musculoskeletal ROS (+)   Abdominal (+) + obese,   Peds  Hematology  (+) Sickle cell trait ,   Anesthesia Other Findings   Reproductive/Obstetrics negative OB ROS                             Anesthesia Physical Anesthesia Plan  ASA: II  Anesthesia Plan: General   Post-op Pain Management:    Induction: Intravenous  Airway Management Planned: Oral ETT  Additional Equipment:   Intra-op Plan:   Post-operative Plan: Extubation in OR  Informed Consent: I have reviewed the patients History and Physical, chart, labs and discussed the procedure including the risks, benefits and alternatives for the proposed anesthesia with the patient or authorized representative who has indicated his/her understanding and acceptance.   Dental advisory given  Plan Discussed with: CRNA  Anesthesia Plan Comments:         Anesthesia Quick Evaluation

## 2016-09-20 ENCOUNTER — Encounter (HOSPITAL_COMMUNITY): Payer: Self-pay | Admitting: Obstetrics and Gynecology

## 2016-09-20 DIAGNOSIS — D250 Submucous leiomyoma of uterus: Secondary | ICD-10-CM | POA: Diagnosis not present

## 2016-09-20 DIAGNOSIS — N393 Stress incontinence (female) (male): Secondary | ICD-10-CM | POA: Diagnosis not present

## 2016-09-20 DIAGNOSIS — N8302 Follicular cyst of left ovary: Secondary | ICD-10-CM | POA: Diagnosis not present

## 2016-09-20 DIAGNOSIS — N920 Excessive and frequent menstruation with regular cycle: Secondary | ICD-10-CM | POA: Diagnosis not present

## 2016-09-20 DIAGNOSIS — D251 Intramural leiomyoma of uterus: Secondary | ICD-10-CM | POA: Diagnosis not present

## 2016-09-20 DIAGNOSIS — N939 Abnormal uterine and vaginal bleeding, unspecified: Secondary | ICD-10-CM | POA: Diagnosis not present

## 2016-09-20 DIAGNOSIS — D271 Benign neoplasm of left ovary: Secondary | ICD-10-CM | POA: Diagnosis not present

## 2016-09-20 LAB — CBC
HCT: 31.4 % — ABNORMAL LOW (ref 36.0–46.0)
HEMOGLOBIN: 11 g/dL — AB (ref 12.0–15.0)
MCH: 29 pg (ref 26.0–34.0)
MCHC: 35 g/dL (ref 30.0–36.0)
MCV: 82.8 fL (ref 78.0–100.0)
Platelets: 286 10*3/uL (ref 150–400)
RBC: 3.79 MIL/uL — AB (ref 3.87–5.11)
RDW: 13.9 % (ref 11.5–15.5)
WBC: 10.4 10*3/uL (ref 4.0–10.5)

## 2016-09-20 MED ORDER — OXYCODONE-ACETAMINOPHEN 5-325 MG PO TABS: 1.0000 | ORAL_TABLET | Freq: Four times a day (QID) | ORAL | 0 refills | Status: DC | PRN

## 2016-09-20 MED ORDER — DIPHENHYDRAMINE HCL 50 MG/ML IJ SOLN
25.0000 mg | Freq: Four times a day (QID) | INTRAMUSCULAR | Status: DC | PRN
  Filled 2016-09-20: qty 1

## 2016-09-20 MED ORDER — IBUPROFEN 600 MG PO TABS: ORAL_TABLET | ORAL | 0 refills | Status: DC

## 2016-09-20 MED ORDER — DIPHENHYDRAMINE HCL 25 MG PO CAPS
25.0000 mg | ORAL_CAPSULE | Freq: Four times a day (QID) | ORAL | Status: DC | PRN
  Administered 2016-09-20: 25 mg via ORAL
  Filled 2016-09-20: qty 1

## 2016-09-20 NOTE — Discharge Instructions (Signed)
Call [facility] @ [phone] if:  You have a temperature greater than or equal to 100.4 degrees Farenheit orally You have pain that is not made better by the pain medication given and taken as directed You have excessive bleeding or problems urinating  Take Colace (Docusate Sodium/Stool Softener) 100 mg 2-3 times daily while taking narcotic pain medicine to avoid constipation or until bowel movements are regular. Take Ibuprofen 600 mg with food every 6 hours for 5 days then as needed for pain  You may drive after 2 weeks You may walk up steps  You may shower  You may resume a regular diet  Keep incisions clean and dry,  remove honeycomb dressing on September 26, 2016 Do not lift over 15 pounds for 6 weeks Avoid anything in vagina for 6 weeks (or until after your post-operative visit)

## 2016-09-20 NOTE — Anesthesia Postprocedure Evaluation (Signed)
Anesthesia Post Note  Patient: [name]  Procedure(s) Performed: Procedure(s) (LRB): LAPAROSCOPIC ASSISTED VAGINAL HYSTERECTOMY Bilateral Sapingectomy (N/A) CYSTOSCOPY  Patient location during evaluation: [facility] Anesthesia Type: General Level of consciousness: awake and alert Pain management: satisfactory to patient Vital Signs Assessment: post-procedure vital signs reviewed and stable Respiratory status: spontaneous breathing and respiratory function stable Cardiovascular status: stable Postop Assessment: adequate PO intake Anesthetic complications: no     Last Vitals:  Vitals:   09/20/16 0551 09/20/16 0610  BP: (!) 119/56   Pulse: 62   Resp: 12 16  Temp: 36.7 C     Last Pain:  Vitals:   09/20/16 0720  TempSrc:   PainSc: 3    Pain Goal: Patients Stated Pain Goal: 1 (09/20/16 0720)               Katherina Mires

## 2016-09-20 NOTE — Progress Notes (Signed)
Scant amount of blood seen after removal of vaginal packing. Patient tolerated it well.

## 2016-09-20 NOTE — Progress Notes (Signed)
[name] is a82 y.o.  Hockley:[id number]  Post Op Date # 1:  LAVH/BS/Cystosopy Subjective: Patient is Doing well postoperatively. Patient has Pain is controlled with current analgesics. Medications being used: narcotic analgesics including Percocet 5/325.Marland Kitchen Patient ambulating in halls without difficulty,  has voided without difficulty and tolerating liquids.    Objective: Vital signs in last 24 hours: Temp:  [97.9 F (36.6 C)-98.7 F (37.1 C)] 98 F (36.7 C) (10/05 0551) Pulse Rate:  [56-108] 62 (10/05 0551) Resp:  [7-19] 16 (10/05 0610) BP: (113-133)/(56-84) 119/56 (10/05 0551) SpO2:  [97 %-100 %] 100 % (10/05 0610) FiO2 (%):  [75 %-90 %] 90 % (10/05 0610) Weight:  [212 lb (96.2 kg)] 212 lb (96.2 kg) (10/05 0300)  Intake/Output from previous day: 10/04 0701 - 10/05 0700 In: 3120 [P.O.:120; I.V.:2800] Out: 1860 [Urine:1610] Intake/Output this shift: No intake/output data recorded.  Recent Labs Lab 09/13/16 0908 09/20/16 0610  WBC 4.3 10.4  HGB 12.1 11.0*  HCT 34.9* 31.4*  PLT 282 286     Recent Labs Lab 09/13/16 0908  NA 137  K 3.5  CL 105  CO2 26  BUN 11  CREATININE 0.88  CALCIUM 9.0  GLUCOSE 80    EXAM: General: alert, cooperative and no distress Resp: clear to auscultation bilaterally Cardio: regular rate and rhythm, S1, S2 normal, no murmur, click, rub or gallop GI: Bowel sounds present and incisions without evidence of infection; umbilical dressing is clean, dry and intact. Extremities: Homans sign is negative, no sign of DVT and no calf tenderness. Vaginal Bleeding: none   Assessment: s/p Procedure(s): LAPAROSCOPIC ASSISTED VAGINAL HYSTERECTOMY Bilateral Sapingectomy CYSTOSCOPY: stable, progressing well and tolerating diet.   Plan: Advance diet Routine care  LOS: 1 day    POWELL,ELMIRA, PA-C 09/20/2016 7:16 AM  Doing well and anticipating discharge.  Discharge instructions reviewed.  Pt will f/u with me in 2wks.

## 2016-09-20 NOTE — Progress Notes (Signed)
Ambulated out  Teaching complete   

## 2016-09-20 NOTE — Discharge Summary (Signed)
Physician Discharge Summary  Patient ID: [name] MRN: [id number] DOB/AGE: 08/14/1976 40 y.o.  Admit date: 09/19/2016 Discharge date: 09/20/2016   Discharge Diagnoses:  Symptomatic Uterine Fibroids and Irregular Bleeding Active Problems:   S/P laparoscopic assisted vaginal hysterectomy (LAVH)   Operation: Laparoscopically Assisted Total  Vaginal Hysterectomy,  Bilateral Salpingectomy, Left Ovarian Cystectomy and Cystoscopy   Discharged Condition: Good  Hospital Course: On the date of admission the patient underwent the aforementioned procedures and tolerated them well.  Post operative course was unremarkable with the patient resuming bowel and bladder function by post operative day #1 and was therefore deemed ready for discharge home.  Discharge hemoglobin was 11.0.  Disposition: 01-Home or Self Care  Discharge Medications:    Medication List    STOP taking these medications   levonorgestrel 20 MCG/24HR IUD Commonly known as:  MIRENA   LO LOESTRIN FE 1 MG-10 MCG / 10 MCG tablet Generic drug:  Norethindrone-Ethinyl Estradiol-Fe Biphas     TAKE these medications   EX-LAX 15 MG Tabs Generic drug:  Sennosides Take 1 tablet by mouth daily as needed (constipation).   ibuprofen 600 MG tablet Commonly known as:  ADVIL,MOTRIN 1  po  pc every 6 hours for 5 days then as needed for pain   oxyCODONE-acetaminophen 5-325 MG tablet Commonly known as:  PERCOCET/ROXICET Take 1-2 tablets by mouth every 6 (six) hours as needed for severe pain (moderate to severe pain (when tolerating fluids)).   polyethylene glycol packet Commonly known as:  MIRALAX / GLYCOLAX Take 17 g by mouth daily as needed for mild constipation or moderate constipation.   SAXENDA 18 MG/3ML Sopn Generic drug:  Liraglutide -Weight Management Inject 3 mLs into the skin every morning.   VANIQA 13.9 % cream Generic drug:  Eflornithine HCl Apply 1 application topically 2 (two) times daily.   VESICARE 5 MG  tablet Generic drug:  solifenacin Take 5 mg by mouth daily.         Follow-up: Dr. Harvie Bridge. Mancel Bale on October 27, 2016 at 11:15 a.m.   SignedEarnstine Regal, PA-C 09/20/2016, 7:28 AM

## 2016-10-04 ENCOUNTER — Ambulatory Visit: Payer: 59 | Admitting: Dietician

## 2016-10-05 MED FILL — SAXENDA 18 MG/3 ML PEN: 18 | 30 days supply | Qty: 15 | Fill #0

## 2016-10-11 MED FILL — PREVIDENT 5000 SENSITIVE PA: 1.1-5 | 30 days supply | Qty: 100 | Fill #0

## 2016-10-12 MED FILL — VESIcare 5 MG TABS: 5 | 30 days supply | Qty: 60 | Fill #1

## 2016-10-18 ENCOUNTER — Encounter: Payer: 59 | Attending: Internal Medicine | Admitting: Dietician

## 2016-10-18 DIAGNOSIS — Z713 Dietary counseling and surveillance: Secondary | ICD-10-CM | POA: Insufficient documentation

## 2016-10-18 NOTE — Progress Notes (Signed)
  Medical Nutrition Therapy:  Appt start time: 1410 end time:  [id number].   Assessment:  Primary concerns today: follow-up for obesity.  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  She has made great progress with her weight loss goals, has lost 27 lbs since our first visit 12 weeks ago for an average weight loss of 2.25 lbs per week.  She is still taking Saxenda for weight loss and reports her sweet cravings are more noticeable now but she has been able to handle them without over indulging or depriving herself completely of these foods.  She has been discussing starting an exercise routine with her husband and they have committed to making more structure within their home which has included eating family dinners at the dining room table without distractions.  They started this about 2 weeks ago and she feels confident they will continue.  Her husband was diagnosed with Ulcerative Colitis and they are awaiting results for preDM.  With these changes they have also been trying to eat more natural, less processed foods.  She has good nutrition questions for me today.  Dietary Recall:  B - 1 sausage link and oatmeal or boiled egg and 2 strips [place] bacon, water L - side salad from [employer] and 4 of her husband's waffle fries, water D - small portion of homemade mac n' cheese, 1-2 oz. pork loin, water Bev - less coffee, at least 32 oz of water per day, sparkling dasani water, occasional sips of soda  Usual physical activity: currently none outside of work, in the past has enjoyed walking first thing in the morning.  Is also interested in HIIT for weight loss and Zumba classes after work at the [employer].  She has moved into the preparation stage of change for exercise and plans to begin exercise this month.      Intervention:  Nutrition education and counseling.   Continued Goals:  - Have breakfast 5-6 days per week - Check in with yourself every 3 hours and ask "how hungry am  I?"  Honor your hunger to reduce "ravenous" hunger throughout the day.  During meals ask yourself halfway through "how full am I getting?" - Eat without distractions (not in the car, not in front of the TV or while multi-tasking at work) - Increase fiber and water intake (can also use the MyPlate guidelines) - Start a physical activity routine.  Find what works best into your schedule, include your family.  Monitoring/Evaluation:  Dietary intake, exercise, progress towards goals, and body weight in 1 month(s).

## 2016-11-06 MED FILL — SAXENDA 18 MG/3 ML PEN: 18 | 30 days supply | Qty: 15 | Fill #1

## 2016-11-29 ENCOUNTER — Ambulatory Visit: Payer: 59 | Admitting: Dietician

## 2016-12-04 MED FILL — SAXENDA 18 MG/3 ML PEN: 18 | 30 days supply | Qty: 15 | Fill #0

## 2017-01-03 ENCOUNTER — Ambulatory Visit: Payer: 59 | Admitting: Dietician

## 2017-01-04 MED FILL — SAXENDA 18 MG/3 ML PEN: 18 | 30 days supply | Qty: 15 | Fill #1

## 2017-03-21 MED FILL — UNIFINE PENTIPS 32GX5/32": 32G X 4 MM | 90 days supply | Qty: 100 | Fill #0

## 2017-03-21 MED FILL — UNIFINE PENTIPS 32GX5/32: 32G X 4 MM | 90 days supply | Qty: 100 | Fill #0

## 2017-08-29 DIAGNOSIS — Z0000 Encounter for general adult medical examination without abnormal findings: Secondary | ICD-10-CM | POA: Diagnosis not present

## 2017-08-29 DIAGNOSIS — Z6838 Body mass index (BMI) 38.0-38.9, adult: Secondary | ICD-10-CM | POA: Diagnosis not present

## 2017-08-29 DIAGNOSIS — Z23 Encounter for immunization: Secondary | ICD-10-CM | POA: Diagnosis not present

## 2017-08-29 DIAGNOSIS — Z1389 Encounter for screening for other disorder: Secondary | ICD-10-CM | POA: Diagnosis not present

## 2017-09-02 MED FILL — VIT D2 1.25 MG (50,000 UNIT: 1.25 MG | 28 days supply | Qty: 8 | Fill #0

## 2017-09-17 MED FILL — SAXENDA 18 MG/3 ML PEN: 18 | 28 days supply | Qty: 15 | Fill #0

## 2017-09-27 DIAGNOSIS — Z124 Encounter for screening for malignant neoplasm of cervix: Secondary | ICD-10-CM | POA: Diagnosis not present

## 2017-09-27 DIAGNOSIS — Z01419 Encounter for gynecological examination (general) (routine) without abnormal findings: Secondary | ICD-10-CM | POA: Diagnosis not present

## 2017-09-27 DIAGNOSIS — Z6838 Body mass index (BMI) 38.0-38.9, adult: Secondary | ICD-10-CM | POA: Diagnosis not present

## 2017-10-14 MED FILL — DOXYCYCLINE HYCLATE 100 MG: 100 | 10 days supply | Qty: 20 | Fill #0

## 2017-10-28 MED FILL — SAXENDA 18 MG/3 ML PEN: 18 | 28 days supply | Qty: 15 | Fill #1

## 2018-06-12 DIAGNOSIS — R000 Tachycardia, unspecified: Secondary | ICD-10-CM | POA: Diagnosis not present

## 2018-06-12 DIAGNOSIS — E669 Obesity, unspecified: Secondary | ICD-10-CM | POA: Diagnosis not present

## 2018-06-12 DIAGNOSIS — Z6839 Body mass index (BMI) 39.0-39.9, adult: Secondary | ICD-10-CM | POA: Diagnosis not present

## 2018-06-12 DIAGNOSIS — L680 Hirsutism: Secondary | ICD-10-CM | POA: Diagnosis not present

## 2018-06-12 DIAGNOSIS — Z79899 Other long term (current) drug therapy: Secondary | ICD-10-CM | POA: Diagnosis not present

## 2018-06-12 LAB — BASIC METABOLIC PANEL
BUN: 11 (ref 4–21)
Creatinine: 0.9 (ref 0.5–1.1)
Glucose: 106
Potassium: 3.9 (ref 3.4–5.3)
Sodium: 140 (ref 137–147)

## 2018-06-12 LAB — CBC AND DIFFERENTIAL
HCT: 39 (ref 36–46)
Hemoglobin: 12.7 (ref 12.0–16.0)
Neutrophils Absolute: 63
WBC: 6.4

## 2018-06-12 LAB — HEMOGLOBIN A1C: Hgb A1c MFr Bld: 5.1 (ref 4.0–6.0)

## 2018-06-23 MED FILL — SPIRONOLACTONE 25 MG TABLET: 25 | 60 days supply | Qty: 60 | Fill #0

## 2018-08-19 MED FILL — SPIRONOLACTONE 25 MG TABS: 25 | 30 days supply | Qty: 30 | Fill #0

## 2018-09-02 ENCOUNTER — Encounter (INDEPENDENT_AMBULATORY_CARE_PROVIDER_SITE_OTHER): Payer: Self-pay

## 2018-09-08 ENCOUNTER — Other Ambulatory Visit: Payer: Self-pay | Admitting: Internal Medicine

## 2018-09-08 DIAGNOSIS — Z1231 Encounter for screening mammogram for malignant neoplasm of breast: Secondary | ICD-10-CM

## 2018-09-11 ENCOUNTER — Ambulatory Visit (INDEPENDENT_AMBULATORY_CARE_PROVIDER_SITE_OTHER): Payer: 59 | Admitting: Bariatrics

## 2018-09-11 ENCOUNTER — Encounter (INDEPENDENT_AMBULATORY_CARE_PROVIDER_SITE_OTHER): Payer: Self-pay | Admitting: Bariatrics

## 2018-09-11 VITALS — BP 118/76 | HR 79 | Temp 98.5°F | Ht 66.0 in | Wt 239.0 lb

## 2018-09-11 DIAGNOSIS — R0602 Shortness of breath: Secondary | ICD-10-CM

## 2018-09-11 DIAGNOSIS — Z6838 Body mass index (BMI) 38.0-38.9, adult: Secondary | ICD-10-CM

## 2018-09-11 DIAGNOSIS — E538 Deficiency of other specified B group vitamins: Secondary | ICD-10-CM | POA: Diagnosis not present

## 2018-09-11 DIAGNOSIS — R5383 Other fatigue: Secondary | ICD-10-CM | POA: Diagnosis not present

## 2018-09-11 DIAGNOSIS — Z0289 Encounter for other administrative examinations: Secondary | ICD-10-CM

## 2018-09-11 DIAGNOSIS — E282 Polycystic ovarian syndrome: Secondary | ICD-10-CM | POA: Diagnosis not present

## 2018-09-11 DIAGNOSIS — E559 Vitamin D deficiency, unspecified: Secondary | ICD-10-CM

## 2018-09-11 DIAGNOSIS — F3289 Other specified depressive episodes: Secondary | ICD-10-CM | POA: Diagnosis not present

## 2018-09-11 DIAGNOSIS — Z9189 Other specified personal risk factors, not elsewhere classified: Secondary | ICD-10-CM | POA: Diagnosis not present
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
## 2018-09-12 LAB — CBC WITH DIFFERENTIAL
Basophils Absolute: 0 10*3/uL (ref 0.0–0.2)
Basos: 0 %
EOS (ABSOLUTE): 0.1 10*3/uL (ref 0.0–0.4)
Eos: 2 %
HEMATOCRIT: 39.5 % (ref 34.0–46.6)
Hemoglobin: 13 g/dL (ref 11.1–15.9)
IMMATURE GRANULOCYTES: 0 %
Immature Grans (Abs): 0 10*3/uL (ref 0.0–0.1)
LYMPHS ABS: 1.4 10*3/uL (ref 0.7–3.1)
Lymphs: 32 %
MCH: 29.7 pg (ref 26.6–33.0)
MCHC: 32.9 g/dL (ref 31.5–35.7)
MCV: 90 fL (ref 79–97)
MONOS ABS: 0.3 10*3/uL (ref 0.1–0.9)
Monocytes: 7 %
Neutrophils Absolute: 2.6 10*3/uL (ref 1.4–7.0)
Neutrophils: 59 %
RBC: 4.38 x10E6/uL (ref 3.77–5.28)
RDW: 13.7 % (ref 12.3–15.4)
WBC: 4.3 10*3/uL (ref 3.4–10.8)

## 2018-09-12 LAB — LIPID PANEL WITH LDL/HDL RATIO
CHOLESTEROL TOTAL: 197 mg/dL (ref 100–199)
HDL: 52 mg/dL (ref >39)
LDL CALC: 127 mg/dL — AB (ref 0–99)
LDL/HDL RATIO: 2.4 ratio (ref 0.0–3.2)
Triglycerides: 91 mg/dL (ref 0–149)
VLDL CHOLESTEROL CAL: 18 mg/dL (ref 5–40)

## 2018-09-12 LAB — T4, FREE: FREE T4: 1.27 ng/dL (ref 0.82–1.77)

## 2018-09-12 LAB — COMPREHENSIVE METABOLIC PANEL
ALK PHOS: 53 IU/L (ref 39–117)
ALT: 10 IU/L (ref 0–32)
AST: 13 IU/L (ref 0–40)
Albumin/Globulin Ratio: 1.8 (ref 1.2–2.2)
Albumin: 4.8 g/dL (ref 3.5–5.5)
BILIRUBIN TOTAL: 0.4 mg/dL (ref 0.0–1.2)
BUN/Creatinine Ratio: 14 (ref 9–23)
BUN: 13 mg/dL (ref 6–24)
CHLORIDE: 100 mmol/L (ref 96–106)
CO2: 22 mmol/L (ref 20–29)
CREATININE: 0.92 mg/dL (ref 0.57–1.00)
Calcium: 9.6 mg/dL (ref 8.7–10.2)
GFR calc Af Amer: 90 mL/min/{1.73_m2} (ref >59)
GFR calc non Af Amer: 78 mL/min/{1.73_m2} (ref >59)
GLOBULIN, TOTAL: 2.6 g/dL (ref 1.5–4.5)
GLUCOSE: 95 mg/dL (ref 65–99)
Potassium: 4.5 mmol/L (ref 3.5–5.2)
SODIUM: 138 mmol/L (ref 134–144)
Total Protein: 7.4 g/dL (ref 6.0–8.5)

## 2018-09-12 LAB — FOLATE: FOLATE: 9.5 ng/mL (ref >3.0)

## 2018-09-12 LAB — HEMOGLOBIN A1C
Est. average glucose Bld gHb Est-mCnc: 103 mg/dL
HEMOGLOBIN A1C: 5.2 % (ref 4.8–5.6)

## 2018-09-12 LAB — TSH: TSH: 0.733 u[IU]/mL (ref 0.450–4.500)

## 2018-09-12 LAB — VITAMIN D 25 HYDROXY (VIT D DEFICIENCY, FRACTURES): Vit D, 25-Hydroxy: 30.9 ng/mL (ref 30.0–100.0)

## 2018-09-12 LAB — INSULIN, RANDOM: INSULIN: 9.7 u[IU]/mL (ref 2.6–24.9)

## 2018-09-12 LAB — VITAMIN B12: Vitamin B-12: >2000 pg/mL — ABNORMAL HIGH (ref 232–1245)

## 2018-09-12 LAB — T3: T3 TOTAL: 120 ng/dL (ref 71–180)

## 2018-09-15 NOTE — Progress Notes (Signed)
Office: [phone]  /  Fax: [phone]   Dear Dr. Baird Cancer,   Thank you for referring [name] to our clinic. The following note includes my evaluation and treatment recommendations.  HPI:   Chief Complaint: OBESITY    [name] has been referred by Glendale Chard, MD for consultation regarding her obesity and obesity related comorbidities.    [name] (MR# [id number]) is a 42 y.o. female who presents on 09/15/2018 for obesity evaluation and treatment. Current BMI is Body mass index is 38.58 kg/m.Marland Kitchen [name] has been struggling with her weight for many years and has been unsuccessful in either losing weight, maintaining weight loss, or reaching her healthy weight goal.     Delories Bryant attended our information session and states she is currently in the action stage of change and ready to dedicate time achieving and maintaining a healthier weight. [name] is interested in becoming our patient and working on intensive lifestyle modifications including (but not limited to) diet, exercise and weight loss.    [name] states her family eats meals together she thinks her family will eat healthier with  her she struggles with family and or coworkers weight loss sabotage her desired weight loss is 54 lbs she has been heavy most of  her life she started gaining weight after having kids her heaviest weight ever was 239 lbs. she has significant food cravings issues  she skips meals frequently she is frequently drinking liquids with calories she frequently makes poor food choices she has problems with excessive hunger  she frequently eats larger portions than normal  she struggles with emotional eating    Fatigue [name] feels her energy is lower than it should be. This has worsened with weight gain and has not worsened recently. [name] admits to daytime somnolence and admits to waking up still tired. Patient is at risk for obstructive sleep apnea. Patent has a history of symptoms of daytime  fatigue and morning fatigue. Patient generally gets 8 hours of sleep per night, and states they generally have restless sleep. Snoring is present. Apneic episodes are not present. Epworth Sleepiness Score is 5  Dyspnea on exertion [name] notes increasing shortness of breath with exercising and seems to be worsening over time with weight gain. She notes getting out of breath sooner with activity than she used to. This has not gotten worse recently. [name] denies any chest pain. [name] denies orthopnea.  Vitamin B12 deficiency [name] has a diagnosis of B12 insufficiency and notes fatigue. This is a new diagnosis. [name] is currently taking vitamin B12 10,000 mcg daily. [name] is not a vegetarian and does not have a previous diagnosis of pernicious anemia. She does not have a history of weight loss surgery.   Vitamin D deficiency [name] has a diagnosis of vitamin D deficiency. She is not currently taking vit D and denies nausea, vomiting or muscle weakness.  At risk for osteopenia and osteoporosis [name] is at higher risk of osteopenia and osteoporosis due to vitamin D deficiency.   PCOS (polycystic ovarian syndrome) [name] has a diagnosis of polycystic ovarian syndrome and she is currently taking Aldactone 25 mg daily. She had a hysterectomy in 2017.  Depression with emotional eating behaviors [name] is struggling with emotional eating and using food for comfort to the extent that it is negatively impacting her health. [name] is obsessing about food and she does not see a Social worker. She often snacks when she is not hungry. [name] sometimes feels she is out of control and  then feels guilty that she made poor food choices. There is no severe trauma noted in her life. She is attempting to work on behavior modification techniques to help reduce her emotional eating. She shows no sign of suicidal or homicidal ideations.  Depression screen PHQ 2/9 09/11/2018  Decreased Interest 3  Down, Depressed, Hopeless 0  PHQ  - 2 Score 3  Altered sleeping 2  Tired, decreased energy 3  Change in appetite 3  Feeling bad or failure about yourself  2  Trouble concentrating 3  Moving slowly or fidgety/restless 0  Suicidal thoughts 0  PHQ-9 Score 16  Difficult doing work/chores Not difficult at all     Depression with Emotional Eating Behaviors We discussed behavior modification techniques today to help [name] deal with her emotional eating and depression.  Depression Screen [name]'s Food and Mood (modified PHQ-9) score was  Depression screen PHQ 2/9 09/11/2018  Decreased Interest 3  Down, Depressed, Hopeless 0  PHQ - 2 Score 3  Altered sleeping 2  Tired, decreased energy 3  Change in appetite 3  Feeling bad or failure about yourself  2  Trouble concentrating 3  Moving slowly or fidgety/restless 0  Suicidal thoughts 0  PHQ-9 Score 16  Difficult doing work/chores Not difficult at all    ALLERGIES: No Known Allergies  MEDICATIONS: Current Outpatient Medications on File Prior to Visit  Medication Sig Dispense Refill  . Cobalamine Combinations (B-12) 1000-400 MCG SUBL Place under the tongue.    Marland Kitchen ibuprofen (ADVIL,MOTRIN) 600 MG tablet 1  po  pc every 6 hours for 5 days then as needed for pain 30 tablet 0  . polyethylene glycol (MIRALAX / GLYCOLAX) packet Take 17 g by mouth daily as needed for mild constipation or moderate constipation.    Marland Kitchen spironolactone (ALDACTONE) 25 MG tablet Take 25 mg by mouth daily.     No current facility-administered medications on file prior to visit.     PAST MEDICAL HISTORY: Past Medical History:  Diagnosis Date  . Anxiety   . Constipation   . Constipation   . Joint pain   . Lactose intolerance   . Nervousness   . Obesity   . PCOS (polycystic ovarian syndrome)   . Sickle cell trait ([place])   . Vaginal delivery 1997, 2005, 2011  . Vitamin D deficiency     PAST SURGICAL HISTORY: Past Surgical History:  Procedure Laterality Date  . CERVICAL CERCLAGE    .  CYSTOSCOPY  09/19/2016   Procedure: CYSTOSCOPY;  Surgeon: Everett Graff, MD;  Location: [facility];  Service: Gynecology;;  . LAPAROSCOPIC ASSISTED VAGINAL HYSTERECTOMY N/A 09/19/2016   Procedure: LAPAROSCOPIC ASSISTED VAGINAL HYSTERECTOMY Bilateral Sapingectomy;  Surgeon: Everett Graff, MD;  Location: [facility];  Service: Gynecology;  Laterality: N/A;  . TONSILLECTOMY AND ADENOIDECTOMY  2009    SOCIAL HISTORY: Social History   Tobacco Use  . Smoking status: Never Smoker  . Smokeless tobacco: Never Used  Substance Use Topics  . Alcohol use: Yes    Comment: wine  . Drug use: No    FAMILY HISTORY: Family History  Problem Relation Age of Onset  . Hypertension Mother   . Alcoholism Mother   . Drug abuse Mother   . Hyperlipidemia Father   . Alcoholism Father   . Drug abuse Father     ROS: Review of Systems  Constitutional: Positive for malaise/fatigue.  Respiratory: Positive for shortness of breath (on exertion).   Cardiovascular: Negative for orthopnea.  Gastrointestinal: Positive for constipation.  Negative for nausea and vomiting.  Musculoskeletal:       + Muscle or Joint Pain Negative for muscle weakness  Psychiatric/Behavioral: Positive for depression. Negative for suicidal ideas. The patient is nervous/anxious.     PHYSICAL EXAM: Blood pressure 118/76, pulse 79, temperature 98.5 F (36.9 C), temperature source Oral, height 5\' 6"  (1.676 m), weight 239 lb (108.4 kg), SpO2 100 %. Body mass index is 38.58 kg/m. Physical Exam  Constitutional: She is oriented to person, place, and time. She appears well-developed and well-nourished.  HENT:  Head: Normocephalic and atraumatic.  Nose: Nose normal.  M[place]anpati = 2  Eyes: EOM are normal. No scleral icterus.  Neck: Normal range of motion. Neck supple. No thyromegaly present.  Cardiovascular: Normal rate and regular rhythm.  Pulmonary/Chest: Effort normal. No respiratory distress.  Abdominal: Soft. There is no tenderness.  +  Obesity  Musculoskeletal: Normal range of motion.  Range of Motion normal in all 4 extremities  Neurological: She is alert and oriented to person, place, and time. Coordination normal.  Skin: Skin is warm and dry.  Psychiatric: She has a normal mood and affect. Her behavior is normal.  Vitals reviewed.   RECENT LABS AND TESTS: BMET    Component Value Date/Time   NA 138 09/11/2018 1201   K 4.5 09/11/2018 1201   CL 100 09/11/2018 1201   CO2 22 09/11/2018 1201   GLUCOSE 95 09/11/2018 1201   GLUCOSE 80 09/13/2016 0908   GLUCOSE 84 07/27/2010 0505   BUN 13 09/11/2018 1201   CREATININE 0.92 09/11/2018 1201   CALCIUM 9.6 09/11/2018 1201   GFRNONAA 78 09/11/2018 1201   GFRAA 90 09/11/2018 1201   Lab Results  Component Value Date   HGBA1C 5.2 09/11/2018   Lab Results  Component Value Date   INSULIN 9.7 09/11/2018   CBC    Component Value Date/Time   WBC 4.3 09/11/2018 1201   WBC 10.4 09/20/2016 0610   RBC 4.38 09/11/2018 1201   RBC 3.79 (L) 09/20/2016 0610   HGB 13.0 09/11/2018 1201   HCT 39.5 09/11/2018 1201   PLT 286 09/20/2016 0610   MCV 90 09/11/2018 1201   MCH 29.7 09/11/2018 1201   MCH 29.0 09/20/2016 0610   MCHC 32.9 09/11/2018 1201   MCHC 35.0 09/20/2016 0610   RDW 13.7 09/11/2018 1201   LYMPHSABS 1.4 09/11/2018 1201   MONOABS 0.3 07/22/2010 0728   EOSABS 0.1 09/11/2018 1201   BASOSABS 0.0 09/11/2018 1201   Iron/TIBC/Ferritin/ %Sat No results found for: IRON, TIBC, FERRITIN, IRONPCTSAT Lipid Panel     Component Value Date/Time   CHOL 197 09/11/2018 1201   TRIG 91 09/11/2018 1201   HDL 52 09/11/2018 1201   LDLCALC 127 (H) 09/11/2018 1201   Hepatic Function Panel     Component Value Date/Time   PROT 7.4 09/11/2018 1201   ALBUMIN 4.8 09/11/2018 1201   AST 13 09/11/2018 1201   ALT 10 09/11/2018 1201   ALKPHOS 53 09/11/2018 1201   BILITOT 0.4 09/11/2018 1201      Component Value Date/Time   TSH 0.733 09/11/2018 1201   TSH 1.091 05/19/2012 1140      ECG  shows NSR with a rate of 82 BPM INDIRECT CALORIMETER done today shows a VO2 of 293 and a REE of 2040.  Her calculated basal metabolic rate is 4098 thus her basal metabolic rate is better than expected.    ASSESSMENT AND PLAN: Other fatigue - Plan: EKG 12-Lead, CBC With Differential, Comprehensive  metabolic panel, Folate, Hemoglobin A1c, Insulin, random, Lipid Panel With LDL/HDL Ratio, T3, T4, free, TSH  Shortness of breath on exertion - Plan: CBC With Differential  B12 nutritional deficiency - Plan: Vitamin B12  Vitamin D deficiency - Plan: VITAMIN D 25 Hydroxy (Vit-D Deficiency, Fractures)  PCOS (polycystic ovarian syndrome) - Plan: Comprehensive metabolic panel  Other depression - with emotional eating  At risk for osteoporosis  Class 2 severe obesity with serious comorbidity and body mass index (BMI) of 38.0 to 38.9 in adult, unspecified obesity type (HCC)  PLAN: Fatigue Yarelli was informed that her fatigue may be related to obesity, depression or many other causes. Labs will be ordered, and in the meanwhile Deeana has agreed to work on diet and weight loss to help with fatigue. Geoffrey will gradually increase exercise (cardio and resistance). Proper sleep hygiene was discussed including the need for 7-8 hours of quality sleep each night. A sleep study was not ordered based on symptoms and Epworth score.  Dyspnea on exertion Jacob's shortness of breath appears to be obesity related and exercise induced. She has agreed to work on weight loss and gradually increase exercise (cardio and resistance) to treat her exercise induced shortness of breath. If Joniqua follows our instructions and loses weight without improvement of her shortness of breath, we will plan to refer to pulmonology. We will monitor this condition regularly. Timmia agrees to this plan.  Vitamin B12 deficiency Tisha will work on increasing B12 rich foods in her diet. Kiasha will continue to take OTC vitamin B12  10,000 mcg daily. We will check labs today and Tami will follow up as directed.  Vitamin D Deficiency Michille was informed that low vitamin D levels contributes to fatigue and are associated with obesity, breast, and colon cancer. We will check vitamin D level and will replace if needed. Kashawna will follow up for routine testing of vitamin D, at least 2-3 times per year.   At risk for osteopenia and osteoporosis Jaliyah was given extended  (15 minutes) osteoporosis prevention counseling today. [name] is at risk for osteopenia and osteoporosis due to her vitamin D deficiency. She was encouraged to take her vitamin D and follow her higher calcium diet and increase strengthening exercise to help strengthen her bones and decrease her risk of osteopenia and osteoporosis.  PCOS (polycystic ovarian syndrome) Shakiyah will continue to take Aldactone 25 mg daily. Weight loss and exercise should help with PCOS. Shahara will follow up with our clinic in 2 weeks.  Depression Screen Korrin had a strongly positive depression screening. Depression is commonly associated with obesity and often results in emotional eating behaviors. We will monitor this closely and work on CBT to help improve the non-hunger eating patterns. We will refer to Dr. Mallie Mussel, our bariatric psychologist and Shanita will follow up with our clinic in 2 weeks.  Obesity Shaquana is currently in the action stage of change and her goal is to continue with weight loss efforts. I recommend Allisa begin the structured treatment plan as follows:  She has agreed to follow the Category 3 plan Marijane has been instructed to eventually work up to a goal of 150 minutes of combined cardio and strengthening exercise per week for weight loss and overall health benefits. We discussed the following Behavioral Modification Strategies today:no skipping meals, increase H2O intake, increasing lean protein intake, decreasing simple carbohydrates  and increasing vegetables  We  will get labs per her PCP.   She was informed of the importance of frequent  follow up visits to maximize her success with intensive lifestyle modifications for her multiple health conditions. She was informed we would discuss her lab results at her next visit unless there is a critical issue that needs to be addressed sooner. Kynnedy agreed to keep her next visit at the agreed upon time to discuss these results.    OBESITY BEHAVIORAL INTERVENTION VISIT  Today's visit was # 1   Starting weight: 239 lbs Starting date: 09/11/18 Today's weight : 239 lbs  Today's date: 9/262019 Total lbs lost to date: 0   ASK: We discussed the diagnosis of obesity with [name] today and Asiyah agreed to give [place] permission to discuss obesity behavioral modification therapy today.  ASSESS: Thomasina has the diagnosis of obesity and her BMI today is 38.59 [place] is in the action stage of change   ADVISE: Jinelle was educated on the multiple health risks of obesity as well as the benefit of weight loss to improve her health. She was advised of the need for long term treatment and the importance of lifestyle modifications to improve her current health and to decrease her risk of future health problems.  AGREE: Multiple dietary modification options and treatment options were discussed and  Lizandra agreed to follow the recommendations documented in the above note.  ARRANGE: Lamya was educated on the importance of frequent visits to treat obesity as outlined per CMS and USPSTF guidelines and agreed to schedule her next follow up appointment today.  Corey Skains, am acting as Location manager for [employer]. Owens Shark, DO  I have reviewed the above documentation for accuracy and completeness, and I agree with the above. -Jearld Lesch, DO

## 2018-09-17 ENCOUNTER — Other Ambulatory Visit: Payer: Self-pay | Admitting: Internal Medicine

## 2018-09-19 ENCOUNTER — Ambulatory Visit
Admission: RE | Admit: 2018-09-19 | Discharge: 2018-09-19 | Disposition: A | Discharge status: Home / Self Care | Payer: 59 | Source: Ambulatory Visit | Attending: Internal Medicine | Admitting: Internal Medicine

## 2018-09-19 DIAGNOSIS — Z1231 Encounter for screening mammogram for malignant neoplasm of breast: Secondary | ICD-10-CM | POA: Diagnosis not present

## 2018-09-23 ENCOUNTER — Ambulatory Visit (INDEPENDENT_AMBULATORY_CARE_PROVIDER_SITE_OTHER): Payer: 59 | Admitting: Psychology

## 2018-09-23 DIAGNOSIS — F418 Other specified anxiety disorders: Secondary | ICD-10-CM

## 2018-09-25 ENCOUNTER — Encounter (INDEPENDENT_AMBULATORY_CARE_PROVIDER_SITE_OTHER): Payer: Self-pay | Admitting: Bariatrics

## 2018-09-25 ENCOUNTER — Ambulatory Visit (INDEPENDENT_AMBULATORY_CARE_PROVIDER_SITE_OTHER): Payer: 59 | Admitting: Bariatrics

## 2018-09-25 VITALS — BP 123/73 | HR 81 | Temp 97.9°F | Ht 66.0 in | Wt 240.0 lb

## 2018-09-25 DIAGNOSIS — E282 Polycystic ovarian syndrome: Secondary | ICD-10-CM

## 2018-09-25 DIAGNOSIS — F3289 Other specified depressive episodes: Secondary | ICD-10-CM

## 2018-09-25 DIAGNOSIS — Z9189 Other specified personal risk factors, not elsewhere classified: Secondary | ICD-10-CM

## 2018-09-25 DIAGNOSIS — E559 Vitamin D deficiency, unspecified: Secondary | ICD-10-CM

## 2018-09-25 DIAGNOSIS — Z6838 Body mass index (BMI) 38.0-38.9, adult: Secondary | ICD-10-CM

## 2018-09-25 MED ORDER — VITAMIN D (ERGOCALCIFEROL) 1.25 MG (50000 UNIT) PO CAPS: 50000.0000 [IU] | ORAL_CAPSULE | ORAL | 0 refills | Status: DC

## 2018-09-25 MED ORDER — BUPROPION HCL ER (SR) 150 MG PO TB12: 150.0000 mg | ORAL_TABLET | Freq: Every day | ORAL | 0 refills | Status: DC

## 2018-09-25 MED FILL — VIT D2 1.25 MG (50,000 UNIT: 1.25 MG | 28 days supply | Qty: 4 | Fill #0

## 2018-09-25 MED FILL — SPIRONOLACTONE 25 MG TABS: 25 | 30 days supply | Qty: 30 | Fill #0

## 2018-09-25 MED FILL — BUPROPION SR 150 MG TABLET: 150 | 30 days supply | Qty: 30 | Fill #0

## 2018-09-29 NOTE — Progress Notes (Signed)
Office: [phone]  /  Fax: [phone]   HPI:   Chief Complaint: OBESITY [name] is here to discuss her progress with her obesity treatment plan. She is on the  follow the Category 3 plan and is following her eating plan approximately 85 % of the time. She states she is exercising 0 minutes 0 times per week. [name] is not getting enough protein at night, probably less meat occasionally eating more more carbohydrates. Cravings at night have decreased.    Her weight is 240 lb (108.9 kg) today and has gained 1 lb since her last visit. She has lost 0 lbs since starting treatment with [place].  PCOS Patient is currently taking Aldactone 25 mg daily. She is status post hysterectomy 2017.  Vitamin D deficiency [name] has a diagnosis of vitamin D deficiency. She is not currently taking vit D and denies nausea, vomiting or muscle weakness.  Depression with emotional eating behaviors [name] is struggling with emotional eating and using food for comfort to the extent that it is negatively impacting her health. She often snacks when she is not hungry. [name] sometimes feels she is out of control and then feels guilty that she made poor food choices. She has been working on behavior modification techniques to help reduce her emotional eating and has been somewhat successful. She shows no sign of suicidal or homicidal ideations.  Depression screen [facility] 2/9 09/23/2018 09/11/2018  Decreased Interest 0 3  Down, Depressed, Hopeless 0 0  PHQ - 2 Score 0 3  Altered sleeping 0 2  Tired, decreased energy 1 3  Change in appetite 1 3  Feeling bad or failure about yourself  0 2  Trouble concentrating 1 3  Moving slowly or fidgety/restless 0 0  Suicidal thoughts 0 0  PHQ-9 Score 3 16  Difficult doing work/chores - Not difficult at all   At risk for diabetes [name] is at higher than averagerisk for developing diabetes due to her obesity. She currently denies polyuria or polydipsia.     ALLERGIES: No Known  Allergies  MEDICATIONS: Current Outpatient Medications on File Prior to Visit  Medication Sig Dispense Refill  . Cobalamine Combinations (B-12) 1000-400 MCG SUBL Place under the tongue.    Marland Kitchen ibuprofen (ADVIL,MOTRIN) 600 MG tablet 1  po  pc every 6 hours for 5 days then as needed for pain 30 tablet 0  . polyethylene glycol (MIRALAX / GLYCOLAX) packet Take 17 g by mouth daily as needed for mild constipation or moderate constipation.    Marland Kitchen spironolactone (ALDACTONE) 25 MG tablet TAKE 1 TABLET BY MOUTH EVERY DAY 30 tablet 0   No current facility-administered medications on file prior to visit.     PAST MEDICAL HISTORY: Past Medical History:  Diagnosis Date  . Anxiety   . Constipation   . Constipation   . Joint pain   . Lactose intolerance   . Nervousness   . Obesity   . PCOS (polycystic ovarian syndrome)   . Sickle cell trait ([place])   . Vaginal delivery 1997, 2005, 2011  . Vitamin D deficiency     PAST SURGICAL HISTORY: Past Surgical History:  Procedure Laterality Date  . CERVICAL CERCLAGE    . CYSTOSCOPY  09/19/2016   Procedure: CYSTOSCOPY;  Surgeon: Everett Graff, MD;  Location: [facility];  Service: Gynecology;;  . LAPAROSCOPIC ASSISTED VAGINAL HYSTERECTOMY N/A 09/19/2016   Procedure: LAPAROSCOPIC ASSISTED VAGINAL HYSTERECTOMY Bilateral Sapingectomy;  Surgeon: Everett Graff, MD;  Location: [facility];  Service: Gynecology;  Laterality:  N/A;  . TONSILLECTOMY AND ADENOIDECTOMY  2009    SOCIAL HISTORY: Social History   Tobacco Use  . Smoking status: Never Smoker  . Smokeless tobacco: Never Used  Substance Use Topics  . Alcohol use: Yes    Comment: wine  . Drug use: No    FAMILY HISTORY: Family History  Problem Relation Age of Onset  . Hypertension Mother   . Alcoholism Mother   . Drug abuse Mother   . Hyperlipidemia Father   . Alcoholism Father   . Drug abuse Father   . Breast cancer Neg Hx     ROS: Review of Systems  All other systems reviewed and are  negative.   PHYSICAL EXAM: Blood pressure 123/73, pulse 81, temperature 97.9 F (36.6 C), temperature source Oral, height 5\' 6"  (1.676 m), weight 240 lb (108.9 kg), SpO2 100 %. Body mass index is 38.74 kg/m. Physical Exam  Constitutional: She is oriented to person, place, and time. She appears well-developed and well-nourished.  Eyes: EOM are normal.  Neck: Normal range of motion.  Pulmonary/Chest: Effort normal.  Neurological: She is alert and oriented to person, place, and time.  Skin: Skin is warm and dry.  Psychiatric: She has a normal mood and affect. Her behavior is normal.  Vitals reviewed.   RECENT LABS AND TESTS: BMET    Component Value Date/Time   NA 138 09/11/2018 1201   K 4.5 09/11/2018 1201   CL 100 09/11/2018 1201   CO2 22 09/11/2018 1201   GLUCOSE 95 09/11/2018 1201   GLUCOSE 80 09/13/2016 0908   GLUCOSE 84 07/27/2010 0505   BUN 13 09/11/2018 1201   CREATININE 0.92 09/11/2018 1201   CALCIUM 9.6 09/11/2018 1201   GFRNONAA 78 09/11/2018 1201   GFRAA 90 09/11/2018 1201   Lab Results  Component Value Date   HGBA1C 5.2 09/11/2018   HGBA1C 5.1 06/12/2018   Lab Results  Component Value Date   INSULIN 9.7 09/11/2018   CBC    Component Value Date/Time   WBC 4.3 09/11/2018 1201   WBC 10.4 09/20/2016 0610   RBC 4.38 09/11/2018 1201   RBC 3.79 (L) 09/20/2016 0610   HGB 13.0 09/11/2018 1201   HCT 39.5 09/11/2018 1201   PLT 286 09/20/2016 0610   MCV 90 09/11/2018 1201   MCH 29.7 09/11/2018 1201   MCH 29.0 09/20/2016 0610   MCHC 32.9 09/11/2018 1201   MCHC 35.0 09/20/2016 0610   RDW 13.7 09/11/2018 1201   LYMPHSABS 1.4 09/11/2018 1201   MONOABS 0.3 07/22/2010 0728   EOSABS 0.1 09/11/2018 1201   BASOSABS 0.0 09/11/2018 1201   Iron/TIBC/Ferritin/ %Sat No results found for: IRON, TIBC, FERRITIN, IRONPCTSAT Lipid Panel     Component Value Date/Time   CHOL 197 09/11/2018 1201   TRIG 91 09/11/2018 1201   HDL 52 09/11/2018 1201   LDLCALC 127 (H)  09/11/2018 1201   Hepatic Function Panel     Component Value Date/Time   PROT 7.4 09/11/2018 1201   ALBUMIN 4.8 09/11/2018 1201   AST 13 09/11/2018 1201   ALT 10 09/11/2018 1201   ALKPHOS 53 09/11/2018 1201   BILITOT 0.4 09/11/2018 1201      Component Value Date/Time   TSH 0.733 09/11/2018 1201   TSH 1.091 05/19/2012 1140    ASSESSMENT AND PLAN: PCOS (polycystic ovarian syndrome)  Vitamin D deficiency - Plan: Vitamin D, Ergocalciferol, (DRISDOL) 50000 units CAPS capsule  Other depression - with emotional eating - Plan: buPROPion (WELLBUTRIN SR)  150 MG 12 hr tablet  At risk for diabetes mellitus  Class 2 severe obesity with serious comorbidity and body mass index (BMI) of 38.0 to 38.9 in adult, unspecified obesity type (HCC)  PLAN: PCOS Will decrease her Carbohydrate intake and increase protein. Will follow up on the next visit.   Depression with Emotional Eating Behaviors We discussed behavior modification techniques today to help [name] deal with her emotional eating and depression. She has agreed to take Wellbutrin SR 150 mg qd in which was written for a 30 day supply  and agreed to follow up as directed.  Vitamin D Deficiency [name] was informed that low vitamin D levels contributes to fatigue and are associated with obesity, breast, and colon cancer. She agrees to continue to take prescription Vit D @50 ,000 IU every week in which a prescription was written today for a 30 day supply and will follow up for routine testing of vitamin D, at least 2-3 times per year. She was informed of the risk of over-replacement of vitamin D and agrees to not increase her dose unless she discusses this with [place] first.  Diabetes risk counselling [name] was given extended (15 minutes) diabetes prevention counseling today. She is 42 y.o. female and has risk factors for diabetes including obesity. We discussed intensive lifestyle modifications today with an emphasis on weight loss as well as  increasing exercise and decreasing simple carbohydrates in her diet.   Obesity [name] is currently in the action stage of change. As such, her goal is to continue with weight loss efforts She has agreed to keep a food journal with 1500-1600 calories and 90+ protein  [name] has been instructed to work up to a goal of 150 minutes of combined cardio and strengthening exercise per week for weight loss and overall health benefits. We discussed the following Behavioral Modification Stratagies today: increasing lean protein intake, increasing vegetables, ways to avoid boredom eating and ways to avoid night time snacking, no skipping meals, increase water intake.    [name] has agreed to follow up with our clinic in 2 weeks. She was informed of the importance of frequent follow up visits to maximize her success with intensive lifestyle modifications for her multiple health conditions.   OBESITY BEHAVIORAL INTERVENTION VISIT  Today's visit was # 2   Starting weight: 239 lbs Starting date: 09/11/18 Today's weight : Weight: 240 lb (108.9 kg)  Today's date: 09/25/18 Total lbs lost to date: 0    ASK: We discussed the diagnosis of obesity with [name] today and [name] agreed to give [place] permission to discuss obesity behavioral modification therapy today.  ASSESS: [name] has the diagnosis of obesity and her BMI today is @TBMI @ [name] is in the action stage of change   ADVISE: [name] was educated on the multiple health risks of obesity as well as the benefit of weight loss to improve her health. She was advised of the need for long term treatment and the importance of lifestyle modifications to improve her current health and to decrease her risk of future health problems.  AGREE: Multiple dietary modification options and treatment options were discussed and  [name] agreed to follow the recommendations documented in the above note.  ARRANGE: [name] was educated on the importance of frequent visits  to treat obesity as outlined per CMS and USPSTF guidelines and agreed to schedule her next follow up appointment today.  I, April Moore , am acting as Location manager for [employer], DO.   I have reviewed  the above documentation for accuracy and completeness, and I agree with the above. -Jearld Lesch, DO

## 2018-10-09 ENCOUNTER — Ambulatory Visit (INDEPENDENT_AMBULATORY_CARE_PROVIDER_SITE_OTHER): Payer: Self-pay | Admitting: Psychology

## 2018-10-09 ENCOUNTER — Ambulatory Visit (INDEPENDENT_AMBULATORY_CARE_PROVIDER_SITE_OTHER): Payer: 59 | Admitting: Family Medicine

## 2018-10-09 VITALS — BP 115/71 | HR 76 | Temp 98.2°F | Ht 66.0 in | Wt 236.0 lb

## 2018-10-09 DIAGNOSIS — Z9189 Other specified personal risk factors, not elsewhere classified: Secondary | ICD-10-CM | POA: Diagnosis not present

## 2018-10-09 DIAGNOSIS — Z6838 Body mass index (BMI) 38.0-38.9, adult: Secondary | ICD-10-CM

## 2018-10-09 DIAGNOSIS — E559 Vitamin D deficiency, unspecified: Secondary | ICD-10-CM

## 2018-10-09 DIAGNOSIS — F3289 Other specified depressive episodes: Secondary | ICD-10-CM

## 2018-10-09 MED ORDER — BUPROPION HCL ER (SR) 150 MG PO TB12: 150.0000 mg | ORAL_TABLET | Freq: Every day | ORAL | 0 refills | Status: DC

## 2018-10-09 MED ORDER — VITAMIN D (ERGOCALCIFEROL) 1.25 MG (50000 UNIT) PO CAPS: 50000.0000 [IU] | ORAL_CAPSULE | ORAL | 0 refills | Status: DC

## 2018-10-13 NOTE — Progress Notes (Signed)
Office: [phone]  /  Fax: [phone]   HPI:   Chief Complaint: OBESITY [name] is here to discuss her progress with her obesity treatment plan. She is on the keep a food journal with 1500-1600 calories and 90+ grams of protein daily and is following her eating plan approximately 75 % of the time. She states she is exercising 0 minutes 0 times per week. [name] continues to do well with weight loss on her journaling. She struggles more with weekends and family sometimes sabotaging her but she is still mindful of her choices.  Her weight is 236 lb (107 kg) today and has had a weight loss of 4 pounds over a period of 2 weeks since her last visit. She has lost 3 lbs since starting treatment with [place].  Vitamin D Deficiency [name] has a diagnosis of vitamin D deficiency. She is stable on prescription Vit D and denies nausea, vomiting or muscle weakness.  At risk for osteopenia and osteoporosis [name] is at higher risk of osteopenia and osteoporosis due to vitamin D deficiency.   Depression with emotional eating behaviors [name] states her mood has improved and she notes decreased emotional eating and cravings. [name] struggles with emotional eating and using food for comfort to the extent that it is negatively impacting her health. She often snacks when she is not hungry. [name] sometimes feels she is out of control and then feels guilty that she made poor food choices. She has been working on behavior modification techniques to help reduce her emotional eating and has been somewhat successful. She shows no sign of suicidal or homicidal ideations.  Depression screen [facility] 2/9 09/23/2018 09/11/2018  Decreased Interest 0 3  Down, Depressed, Hopeless 0 0  PHQ - 2 Score 0 3  Altered sleeping 0 2  Tired, decreased energy 1 3  Change in appetite 1 3  Feeling bad or failure about yourself  0 2  Trouble concentrating 1 3  Moving slowly or fidgety/restless 0 0  Suicidal thoughts 0 0  PHQ-9 Score 3 16    Difficult doing work/chores - Not difficult at all    ALLERGIES: No Known Allergies  MEDICATIONS: Current Outpatient Medications on File Prior to Visit  Medication Sig Dispense Refill  . ibuprofen (ADVIL,MOTRIN) 600 MG tablet 1  po  pc every 6 hours for 5 days then as needed for pain 30 tablet 0  . spironolactone (ALDACTONE) 25 MG tablet TAKE 1 TABLET BY MOUTH EVERY DAY 30 tablet 0   No current facility-administered medications on file prior to visit.     PAST MEDICAL HISTORY: Past Medical History:  Diagnosis Date  . Anxiety   . Constipation   . Constipation   . Joint pain   . Lactose intolerance   . Nervousness   . Obesity   . PCOS (polycystic ovarian syndrome)   . Sickle cell trait ([place])   . Vaginal delivery 1997, 2005, 2011  . Vitamin D deficiency     PAST SURGICAL HISTORY: Past Surgical History:  Procedure Laterality Date  . CERVICAL CERCLAGE    . CYSTOSCOPY  09/19/2016   Procedure: CYSTOSCOPY;  Surgeon: Everett Graff, MD;  Location: [facility];  Service: Gynecology;;  . LAPAROSCOPIC ASSISTED VAGINAL HYSTERECTOMY N/A 09/19/2016   Procedure: LAPAROSCOPIC ASSISTED VAGINAL HYSTERECTOMY Bilateral Sapingectomy;  Surgeon: Everett Graff, MD;  Location: [facility];  Service: Gynecology;  Laterality: N/A;  . TONSILLECTOMY AND ADENOIDECTOMY  2009    SOCIAL HISTORY: Social History   Tobacco Use  .  Smoking status: Never Smoker  . Smokeless tobacco: Never Used  Substance Use Topics  . Alcohol use: Yes    Comment: wine  . Drug use: No    FAMILY HISTORY: Family History  Problem Relation Age of Onset  . Hypertension Mother   . Alcoholism Mother   . Drug abuse Mother   . Hyperlipidemia Father   . Alcoholism Father   . Drug abuse Father   . Breast cancer Neg Hx     ROS: Review of Systems  Constitutional: Positive for weight loss.  Gastrointestinal: Negative for nausea and vomiting.  Musculoskeletal:       Negative muscle weakness   Psychiatric/Behavioral: Positive  for depression. Negative for suicidal ideas.    PHYSICAL EXAM: Blood pressure 115/71, pulse 76, temperature 98.2 F (36.8 C), temperature source Oral, height 5\' 6"  (1.676 m), weight 236 lb (107 kg), SpO2 100 %. Body mass index is 38.09 kg/m. Physical Exam  Constitutional: She is oriented to person, place, and time. She appears well-developed and well-nourished.  Cardiovascular: Normal rate.  Pulmonary/Chest: Effort normal.  Musculoskeletal: Normal range of motion.  Neurological: She is oriented to person, place, and time.  Skin: Skin is warm and dry.  Psychiatric: She has a normal mood and affect. Her behavior is normal.  Vitals reviewed.   RECENT LABS AND TESTS: BMET    Component Value Date/Time   NA 138 09/11/2018 1201   K 4.5 09/11/2018 1201   CL 100 09/11/2018 1201   CO2 22 09/11/2018 1201   GLUCOSE 95 09/11/2018 1201   GLUCOSE 80 09/13/2016 0908   GLUCOSE 84 07/27/2010 0505   BUN 13 09/11/2018 1201   CREATININE 0.92 09/11/2018 1201   CALCIUM 9.6 09/11/2018 1201   GFRNONAA 78 09/11/2018 1201   GFRAA 90 09/11/2018 1201   Lab Results  Component Value Date   HGBA1C 5.2 09/11/2018   HGBA1C 5.1 06/12/2018   Lab Results  Component Value Date   INSULIN 9.7 09/11/2018   CBC    Component Value Date/Time   WBC 4.3 09/11/2018 1201   WBC 10.4 09/20/2016 0610   RBC 4.38 09/11/2018 1201   RBC 3.79 (L) 09/20/2016 0610   HGB 13.0 09/11/2018 1201   HCT 39.5 09/11/2018 1201   PLT 286 09/20/2016 0610   MCV 90 09/11/2018 1201   MCH 29.7 09/11/2018 1201   MCH 29.0 09/20/2016 0610   MCHC 32.9 09/11/2018 1201   MCHC 35.0 09/20/2016 0610   RDW 13.7 09/11/2018 1201   LYMPHSABS 1.4 09/11/2018 1201   MONOABS 0.3 07/22/2010 0728   EOSABS 0.1 09/11/2018 1201   BASOSABS 0.0 09/11/2018 1201   Iron/TIBC/Ferritin/ %Sat No results found for: IRON, TIBC, FERRITIN, IRONPCTSAT Lipid Panel     Component Value Date/Time   CHOL 197 09/11/2018 1201   TRIG 91 09/11/2018 1201   HDL  52 09/11/2018 1201   LDLCALC 127 (H) 09/11/2018 1201   Hepatic Function Panel     Component Value Date/Time   PROT 7.4 09/11/2018 1201   ALBUMIN 4.8 09/11/2018 1201   AST 13 09/11/2018 1201   ALT 10 09/11/2018 1201   ALKPHOS 53 09/11/2018 1201   BILITOT 0.4 09/11/2018 1201      Component Value Date/Time   TSH 0.733 09/11/2018 1201   TSH 1.091 05/19/2012 1140  Results for Baquero, Anivea L (MRN [id number]) as of 10/13/2018 10:58  Ref. Range 09/11/2018 12:01  Vitamin D, 25-Hydroxy Latest Ref Range: 30.0 - 100.0 ng/mL 30.9    ASSESSMENT  AND PLAN: Vitamin D deficiency - Plan: Vitamin D, Ergocalciferol, (DRISDOL) 50000 units CAPS capsule  Other depression - with emotional eating - Plan: buPROPion (WELLBUTRIN SR) 150 MG 12 hr tablet  At risk for osteoporosis  Class 2 severe obesity with serious comorbidity and body mass index (BMI) of 38.0 to 38.9 in adult, unspecified obesity type (HCC)  PLAN:  Vitamin D Deficiency [name] was informed that low vitamin D levels contributes to fatigue and are associated with obesity, breast, and colon cancer. [name] agrees to continue taking prescription Vit D @50 ,000 IU every week #4 and we will refill for 1 month. She will follow up for routine testing of vitamin D, at least 2-3 times per year. She was informed of the risk of over-replacement of vitamin D and agrees to not increase her dose unless she discusses this with [place] first. [name] agrees to follow up with our clinic in 2 to 3 weeks.  At risk for osteopenia and osteoporosis [name] was given extended (15 minutes) osteoporosis prevention counseling today. [name] is at risk for osteopenia and osteoporsis due to her vitamin D deficiency. She was encouraged to take her vitamin D and follow her higher calcium diet and increase strengthening exercise to help strengthen her bones and decrease her risk of osteopenia and osteoporosis.  Depression with Emotional Eating Behaviors We discussed behavior  modification techniques today to help [name] deal with her emotional eating and depression. [name] agrees to continue taking Wellbutrin SR 150 mg qd #30 and we will refill for 1 month. [name] agrees to follow up with our clinic in 2 to 3 weeks.  Obesity [name] is currently in the action stage of change. As such, her goal is to continue with weight loss efforts She has agreed to keep a food journal with 1600 calories and 90+ grams of protein daily [name] has been instructed to work up to a goal of 150 minutes of combined cardio and strengthening exercise per week for weight loss and overall health benefits. We discussed the following Behavioral Modification Strategies today: increasing lean protein intake, decreasing simple carbohydrates, work on meal planning and easy cooking plans and emotional eating strategies   [name] has agreed to follow up with our clinic in 2 to 3 weeks. She was informed of the importance of frequent follow up visits to maximize her success with intensive lifestyle modifications for her multiple health conditions.   OBESITY BEHAVIORAL INTERVENTION VISIT  Today's visit was # 3   Starting weight: 239 lbs Starting date: 09/11/18 Today's weight : 236 lbs Today's date: 10/09/2018 Total lbs lost to date: 3    ASK: We discussed the diagnosis of obesity with [name] today and [name] agreed to give [place] permission to discuss obesity behavioral modification therapy today.  ASSESS: [name] has the diagnosis of obesity and her BMI today is 38.11 [name] is in the action stage of change   ADVISE: [name] was educated on the multiple health risks of obesity as well as the benefit of weight loss to improve her health. She was advised of the need for long term treatment and the importance of lifestyle modifications to improve her current health and to decrease her risk of future health problems.  AGREE: Multiple dietary modification options and treatment options were discussed and   [name] agreed to follow the recommendations documented in the above note.  ARRANGE: [name] was educated on the importance of frequent visits to treat obesity as outlined per CMS and USPSTF guidelines and agreed to  schedule her next follow up appointment today.  Wilhemena Durie, am acting as transcriptionist for Dennard Nip, MD

## 2018-10-14 ENCOUNTER — Encounter (INDEPENDENT_AMBULATORY_CARE_PROVIDER_SITE_OTHER): Payer: Self-pay | Admitting: Family Medicine

## 2018-10-15 ENCOUNTER — Encounter: Payer: 59 | Admitting: Internal Medicine

## 2018-10-20 ENCOUNTER — Encounter: Payer: Self-pay | Admitting: Internal Medicine

## 2018-10-20 ENCOUNTER — Ambulatory Visit: Payer: 59 | Admitting: Internal Medicine

## 2018-10-20 VITALS — BP 118/78 | HR 84 | Temp 98.4°F | Ht 66.0 in | Wt 240.0 lb

## 2018-10-20 DIAGNOSIS — Z712 Person consulting for explanation of examination or test findings: Secondary | ICD-10-CM

## 2018-10-20 DIAGNOSIS — Z0000 Encounter for general adult medical examination without abnormal findings: Secondary | ICD-10-CM

## 2018-10-20 LAB — POCT URINALYSIS DIPSTICK
Bilirubin, UA: NEGATIVE
Glucose, UA: NEGATIVE
Ketones, UA: NEGATIVE
LEUKOCYTES UA: NEGATIVE
NITRITE UA: NEGATIVE
PROTEIN UA: NEGATIVE
SPEC GRAV UA: 1.015 (ref 1.010–1.025)
Urobilinogen, UA: 0.2 E.U./dL
pH, UA: 7.5 (ref 5.0–8.0)

## 2018-10-20 NOTE — Patient Instructions (Signed)
Exercising to Lose Weight Exercising can help you to lose weight. In order to lose weight through exercise, you need to do vigorous-intensity exercise. You can tell that you are exercising with vigorous intensity if you are breathing very hard and fast and cannot hold a conversation while exercising. Moderate-intensity exercise helps to maintain your current weight. You can tell that you are exercising at a moderate level if you have a higher heart rate and faster breathing, but you are still able to hold a conversation. How often should I exercise? Choose an activity that you enjoy and set realistic goals. Your health care provider can help you to make an activity plan that works for you. Exercise regularly as directed by your health care provider. This may include:  Doing resistance training twice each week, such as: ? Push-ups. ? Sit-ups. ? Lifting weights. ? Using resistance bands.  Doing a given intensity of exercise for a given amount of time. Choose from these options: ? 150 minutes of moderate-intensity exercise every week. ? 75 minutes of vigorous-intensity exercise every week. ? A mix of moderate-intensity and vigorous-intensity exercise every week.  Children, pregnant women, people who are out of shape, people who are overweight, and older adults may need to consult a health care provider for individual recommendations. If you have any sort of medical condition, be sure to consult your health care provider before starting a new exercise program. What are some activities that can help me to lose weight?  Walking at a rate of at least 4.5 miles an hour.  Jogging or running at a rate of 5 miles per hour.  Biking at a rate of at least 10 miles per hour.  Lap swimming.  Roller-skating or in-line skating.  Cross-country skiing.  Vigorous competitive sports, such as football, basketball, and soccer.  Jumping rope.  Aerobic dancing. How can I be more active in my day-to-day  activities?  Use the stairs instead of the elevator.  Take a walk during your lunch break.  If you drive, park your car farther away from work or school.  If you take public transportation, get off one stop early and walk the rest of the way.  Make all of your phone calls while standing up and walking around.  Get up, stretch, and walk around every 30 minutes throughout the day. What guidelines should I follow while exercising?  Do not exercise so much that you hurt yourself, feel dizzy, or get very short of breath.  Consult your health care provider prior to starting a new exercise program.  Wear comfortable clothes and shoes with good support.  Drink plenty of water while you exercise to prevent dehydration or heat stroke. Body water is lost during exercise and must be replaced.  Work out until you breathe faster and your heart beats faster. This information is not intended to replace advice given to you by your health care provider. Make sure you discuss any questions you have with your health care provider. Document Released: 01/05/2011 Document Revised: 05/10/2016 Document Reviewed: 05/06/2014 Elsevier Interactive Patient Education  2018 [employer]  

## 2018-10-20 NOTE — Addendum Note (Signed)
Addended by: Electa Sniff on: 10/20/2018 12:32 PM   Modules accepted: Orders

## 2018-10-20 NOTE — Progress Notes (Signed)
Subjective:     Patient ID: [name] , female    DOB: 1976/08/27 , 42 y.o.   MRN: [id number]   Chief Complaint  Patient presents with  . Annual Exam    HPI  She is here today for a full physical exam. She is followed by Dr. Mancel Bale for her pelvic exams. She is s/p partial hysterectomy. She has no specific concerns or complaints at this time.      No LMP recorded. Patient has had a hysterectomy.. Negative for: breast discharge, breast lump(s), breast pain and breast self exam. Associated symptoms include abnormal vaginal bleeding. Pertinent negatives include abnormal bleeding (hematology), anxiety, decreased libido, depression, difficulty falling sleep, dyspareunia, history of infertility, nocturia, sexual dysfunction, sleep disturbances, urinary incontinence, urinary urgency, vaginal discharge and vaginal itching. Diet regular.The patient states her exercise level is  Minimal.   . The patient's tobacco use is:  Social History   Tobacco Use  Smoking Status Never Smoker  Smokeless Tobacco Never Used  . She has been exposed to passive smoke. The patient's alcohol use is:  Social History   Substance and Sexual Activity  Alcohol Use Yes   Comment: wine  . Additional information: Last pap 2017.   Past Medical History:  Diagnosis Date  . Anxiety   . Constipation   . Constipation   . Joint pain   . Lactose intolerance   . Nervousness   . Obesity   . PCOS (polycystic ovarian syndrome)   . Sickle cell trait ([place])   . Vaginal delivery 1997, 2005, 2011  . Vitamin D deficiency      Family History  Problem Relation Age of Onset  . Hypertension Mother   . Alcoholism Mother   . Drug abuse Mother   . Hyperlipidemia Father   . Alcoholism Father   . Drug abuse Father   . Breast cancer Neg Hx      Current Outpatient Medications:  .  buPROPion (WELLBUTRIN SR) 150 MG 12 hr tablet, Take 1 tablet (150 mg total) by mouth daily., Disp: 30 tablet, Rfl: 0 .  ibuprofen  (ADVIL,MOTRIN) 600 MG tablet, 1  po  pc every 6 hours for 5 days then as needed for pain, Disp: 30 tablet, Rfl: 0 .  spironolactone (ALDACTONE) 25 MG tablet, TAKE 1 TABLET BY MOUTH EVERY DAY, Disp: 30 tablet, Rfl: 0 .  Vitamin D, Ergocalciferol, (DRISDOL) 50000 units CAPS capsule, Take 1 capsule (50,000 Units total) by mouth every 7 (seven) days., Disp: 4 capsule, Rfl: 0   No Known Allergies   Review of Systems  Constitutional: Negative.   HENT: Negative.   Eyes: Negative.   Respiratory: Negative.   Cardiovascular: Negative.   Gastrointestinal: Negative.   Endocrine: Negative.   Genitourinary: Negative.   Musculoskeletal: Negative.   Skin: Negative.   Allergic/Immunologic: Negative.   Neurological: Negative.   Hematological: Negative.   Psychiatric/Behavioral: Negative.      Today's Vitals   10/20/18 1002  BP: 118/78  Pulse: 84  Temp: 98.4 F (36.9 C)  TempSrc: Oral  SpO2: 99%  Weight: 240 lb (108.9 kg)  Height: 5\' 6"  (1.676 m)   Body mass index is 38.74 kg/m.   Objective:  Physical Exam  Constitutional: She is oriented to person, place, and time. She appears well-developed and well-nourished.  HENT:  Head: Normocephalic and atraumatic.  Right Ear: External ear normal.  Left Ear: External ear normal.  Nose: Nose normal.  Mouth/Throat: Oropharynx is clear and moist.  Eyes:  Pupils are equal, round, and reactive to light. Conjunctivae and EOM are normal.  Neck: Normal range of motion. Neck supple.  Cardiovascular: Normal rate, regular rhythm, normal heart sounds and intact distal pulses.  Pulmonary/Chest: Effort normal and breath sounds normal. Right breast exhibits no inverted nipple, no mass, no nipple discharge, no skin change and no tenderness. Left breast exhibits no inverted nipple, no mass, no nipple discharge, no skin change and no tenderness.  Abdominal: Soft. Bowel sounds are normal.  Genitourinary:  Genitourinary Comments: deferred  Musculoskeletal: Normal  range of motion.  Neurological: She is alert and oriented to person, place, and time.  Skin: Skin is warm and dry.  Psychiatric: She has a normal mood and affect.  Nursing note and vitals reviewed.       Assessment And Plan:     1. Routine general medical examination at health care facility  A full exam was performed.  Importance of monthly breast exams was discussed with the patient.  She is encouraged to perform SBE on her birth date, the 11th of every month. Most recent mammogram results were reviewed in full detail. I also reviewed results drawn at [facility]. She is encouraged to incorporate more exercise into her daily routine. Her initial goal is to exercise on the weekends. She is up to date with her immunizations.    Maximino Greenland, MD

## 2018-10-21 LAB — HIV ANTIBODY (ROUTINE TESTING W REFLEX): HIV SCREEN 4TH GENERATION: NONREACTIVE

## 2018-10-21 NOTE — Progress Notes (Signed)
Hello,   You are HIV negative.   It was great seeing you as always! May you have an awesome school year!   Have a great week!  Sincerely,    Swara Donze N. Baird [facility], MD

## 2018-10-24 ENCOUNTER — Other Ambulatory Visit (INDEPENDENT_AMBULATORY_CARE_PROVIDER_SITE_OTHER): Payer: Self-pay | Admitting: Bariatrics

## 2018-10-24 DIAGNOSIS — F3289 Other specified depressive episodes: Secondary | ICD-10-CM

## 2018-10-24 MED FILL — BUPROPION SR 150 MG TABLET: 150 | 30 days supply | Qty: 30 | Fill #0

## 2018-10-27 MED FILL — VANIQA 13.9% CREAM: 13.9 | 30 days supply | Qty: 45 | Fill #0

## 2018-10-29 ENCOUNTER — Encounter (INDEPENDENT_AMBULATORY_CARE_PROVIDER_SITE_OTHER): Payer: Self-pay

## 2018-10-30 ENCOUNTER — Ambulatory Visit (INDEPENDENT_AMBULATORY_CARE_PROVIDER_SITE_OTHER): Payer: 59 | Admitting: Family Medicine

## 2018-10-30 VITALS — BP 116/75 | HR 86 | Temp 98.3°F | Ht 66.0 in | Wt 235.0 lb

## 2018-10-30 DIAGNOSIS — Z6838 Body mass index (BMI) 38.0-38.9, adult: Secondary | ICD-10-CM | POA: Diagnosis not present

## 2018-10-30 DIAGNOSIS — K5909 Other constipation: Secondary | ICD-10-CM

## 2018-11-03 NOTE — Progress Notes (Signed)
Office: [phone]  /  Fax: [phone]   HPI:   Chief Complaint: OBESITY [name] is here to discuss her progress with her obesity treatment plan. She is on the keep a food journal with 1600 calories and 90+ grams of protein daily and is following her eating plan approximately 15 % of the time. She states she is walking for 30 minutes 3 times per week. [name] continues to do well with weight loss, but has fallen off track with journaling. She is doing mostly portion control and smart choices. She was being mindful of food choices but is ready to get back on track.  Her weight is 235 lb (106.6 kg) today and has had a weight loss of 1 pound over a period of 3 weeks since her last visit. She has lost 4 lbs since starting treatment with [place].  Constipation [name] notes decreased BM frequency, increased abdominal cramping, but denies rectal bleeding and feels very bloated. She has tried OTC laxatives occasionally, but they do not offer long term improvement. She admits to drinking less H20 recently.  ALLERGIES: No Known Allergies  MEDICATIONS: Current Outpatient Medications on File Prior to Visit  Medication Sig Dispense Refill  . buPROPion (WELLBUTRIN SR) 150 MG 12 hr tablet Take 1 tablet (150 mg total) by mouth daily. 30 tablet 0  . ibuprofen (ADVIL,MOTRIN) 600 MG tablet 1  po  pc every 6 hours for 5 days then as needed for pain 30 tablet 0  . polyethylene glycol (MIRALAX / GLYCOLAX) packet Take 17 g by mouth daily.    Marland Kitchen spironolactone (ALDACTONE) 25 MG tablet TAKE 1 TABLET BY MOUTH EVERY DAY 30 tablet 0  . Vitamin D, Ergocalciferol, (DRISDOL) 50000 units CAPS capsule Take 1 capsule (50,000 Units total) by mouth every 7 (seven) days. 4 capsule 0   No current facility-administered medications on file prior to visit.     PAST MEDICAL HISTORY: Past Medical History:  Diagnosis Date  . Anxiety   . Constipation   . Constipation   . Joint pain   . Lactose intolerance   . Nervousness   .  Obesity   . PCOS (polycystic ovarian syndrome)   . Sickle cell trait ([place])   . Vaginal delivery 1997, 2005, 2011  . Vitamin D deficiency     PAST SURGICAL HISTORY: Past Surgical History:  Procedure Laterality Date  . CERVICAL CERCLAGE    . CYSTOSCOPY  09/19/2016   Procedure: CYSTOSCOPY;  Surgeon: Everett Graff, MD;  Location: [facility];  Service: Gynecology;;  . LAPAROSCOPIC ASSISTED VAGINAL HYSTERECTOMY N/A 09/19/2016   Procedure: LAPAROSCOPIC ASSISTED VAGINAL HYSTERECTOMY Bilateral Sapingectomy;  Surgeon: Everett Graff, MD;  Location: [facility];  Service: Gynecology;  Laterality: N/A;  . TONSILLECTOMY AND ADENOIDECTOMY  2009    SOCIAL HISTORY: Social History   Tobacco Use  . Smoking status: Never Smoker  . Smokeless tobacco: Never Used  Substance Use Topics  . Alcohol use: Yes    Comment: wine  . Drug use: No    FAMILY HISTORY: Family History  Problem Relation Age of Onset  . Hypertension Mother   . Alcoholism Mother   . Drug abuse Mother   . Hyperlipidemia Father   . Alcoholism Father   . Drug abuse Father   . Breast cancer Neg Hx     ROS: Review of Systems  Constitutional: Positive for weight loss.  Gastrointestinal: Positive for constipation.       + Abdominal cramping Negative rectal bleeding    PHYSICAL  EXAM: Blood pressure 116/75, pulse 86, temperature 98.3 F (36.8 C), temperature source Oral, height 5\' 6"  (1.676 m), weight 235 lb (106.6 kg), SpO2 100 %. Body mass index is 37.93 kg/m. Physical Exam  Constitutional: She is oriented to person, place, and time. She appears well-developed and well-nourished.  Cardiovascular: Normal rate.  Pulmonary/Chest: Effort normal.  Musculoskeletal: Normal range of motion.  Neurological: She is oriented to person, place, and time.  Skin: Skin is warm and dry.  Psychiatric: She has a normal mood and affect. Her behavior is normal.  Vitals reviewed.   RECENT LABS AND TESTS: BMET    Component Value Date/Time   NA  138 09/11/2018 1201   K 4.5 09/11/2018 1201   CL 100 09/11/2018 1201   CO2 22 09/11/2018 1201   GLUCOSE 95 09/11/2018 1201   GLUCOSE 80 09/13/2016 0908   GLUCOSE 84 07/27/2010 0505   BUN 13 09/11/2018 1201   CREATININE 0.92 09/11/2018 1201   CALCIUM 9.6 09/11/2018 1201   GFRNONAA 78 09/11/2018 1201   GFRAA 90 09/11/2018 1201   Lab Results  Component Value Date   HGBA1C 5.2 09/11/2018   HGBA1C 5.1 06/12/2018   Lab Results  Component Value Date   INSULIN 9.7 09/11/2018   CBC    Component Value Date/Time   WBC 4.3 09/11/2018 1201   WBC 10.4 09/20/2016 0610   RBC 4.38 09/11/2018 1201   RBC 3.79 (L) 09/20/2016 0610   HGB 13.0 09/11/2018 1201   HCT 39.5 09/11/2018 1201   PLT 286 09/20/2016 0610   MCV 90 09/11/2018 1201   MCH 29.7 09/11/2018 1201   MCH 29.0 09/20/2016 0610   MCHC 32.9 09/11/2018 1201   MCHC 35.0 09/20/2016 0610   RDW 13.7 09/11/2018 1201   LYMPHSABS 1.4 09/11/2018 1201   MONOABS 0.3 07/22/2010 0728   EOSABS 0.1 09/11/2018 1201   BASOSABS 0.0 09/11/2018 1201   Iron/TIBC/Ferritin/ %Sat No results found for: IRON, TIBC, FERRITIN, IRONPCTSAT Lipid Panel     Component Value Date/Time   CHOL 197 09/11/2018 1201   TRIG 91 09/11/2018 1201   HDL 52 09/11/2018 1201   LDLCALC 127 (H) 09/11/2018 1201   Hepatic Function Panel     Component Value Date/Time   PROT 7.4 09/11/2018 1201   ALBUMIN 4.8 09/11/2018 1201   AST 13 09/11/2018 1201   ALT 10 09/11/2018 1201   ALKPHOS 53 09/11/2018 1201   BILITOT 0.4 09/11/2018 1201      Component Value Date/Time   TSH 0.733 09/11/2018 1201   TSH 1.091 05/19/2012 1140    ASSESSMENT AND PLAN: Other constipation  Class 2 severe obesity with serious comorbidity and body mass index (BMI) of 38.0 to 38.9 in adult, unspecified obesity type (HCC)  PLAN:  Constipation [name] was informed decrease bowel movement frequency is normal while losing weight, but stools should not be hard or painful. [name] agrees to start  miralax OTC as needed. She was advised to increase her H20 intake and work on increasing her fiber intake. High fiber foods were discussed today. [name] agrees to follow up with our clinic in 3 weeks.  I spent > than 50% of the 25 minute visit on counseling as documented in the note.  Obesity [name] is currently in the action stage of change. As such, her goal is to continue with weight loss efforts She has agreed to keep a food journal with 1600 calories and 90+ grams of protein daily [name] has been instructed to work up  to a goal of 150 minutes of combined cardio and strengthening exercise per week for weight loss and overall health benefits. We discussed the following Behavioral Modification Strategies today: increasing fiber rich foods and increase H20 intake   [name] has agreed to follow up with our clinic in 3 weeks. She was informed of the importance of frequent follow up visits to maximize her success with intensive lifestyle modifications for her multiple health conditions.   OBESITY BEHAVIORAL INTERVENTION VISIT  Today's visit was # 4   Starting weight: 239 lbs Starting date: 09/11/18 Today's weight : 235 lbs  Today's date: 10/30/2018 Total lbs lost to date: 4    ASK: We discussed the diagnosis of obesity with [name] today and [name] agreed to give [place] permission to discuss obesity behavioral modification therapy today.  ASSESS: [name] has the diagnosis of obesity and her BMI today is 37.95 [name] is in the action stage of change   ADVISE: [name] was educated on the multiple health risks of obesity as well as the benefit of weight loss to improve her health. She was advised of the need for long term treatment and the importance of lifestyle modifications to improve her current health and to decrease her risk of future health problems.  AGREE: Multiple dietary modification options and treatment options were discussed and  [name] agreed to follow the recommendations  documented in the above note.  ARRANGE: [name] was educated on the importance of frequent visits to treat obesity as outlined per CMS and USPSTF guidelines and agreed to schedule her next follow up appointment today.  I, Trixie Dredge, am acting as transcriptionist for Dennard Nip, MD  I have reviewed the above documentation for accuracy and completeness, and I agree with the above. -Dennard Nip, MD

## 2018-11-24 ENCOUNTER — Encounter (INDEPENDENT_AMBULATORY_CARE_PROVIDER_SITE_OTHER): Payer: Self-pay | Admitting: Family Medicine

## 2018-11-24 ENCOUNTER — Ambulatory Visit (INDEPENDENT_AMBULATORY_CARE_PROVIDER_SITE_OTHER): Payer: 59 | Admitting: Family Medicine

## 2018-11-24 VITALS — BP 115/73 | HR 74 | Temp 98.4°F | Ht 66.0 in | Wt 238.0 lb

## 2018-11-24 DIAGNOSIS — Z9189 Other specified personal risk factors, not elsewhere classified: Secondary | ICD-10-CM | POA: Diagnosis not present

## 2018-11-24 DIAGNOSIS — Z6838 Body mass index (BMI) 38.0-38.9, adult: Secondary | ICD-10-CM | POA: Diagnosis not present

## 2018-11-24 DIAGNOSIS — E559 Vitamin D deficiency, unspecified: Secondary | ICD-10-CM

## 2018-11-24 DIAGNOSIS — F3289 Other specified depressive episodes: Secondary | ICD-10-CM | POA: Diagnosis not present

## 2018-11-24 MED ORDER — BUPROPION HCL ER (SR) 200 MG PO TB12: 200.0000 mg | ORAL_TABLET | Freq: Every day | ORAL | 0 refills | Status: DC

## 2018-11-24 MED ORDER — VITAMIN D (ERGOCALCIFEROL) 1.25 MG (50000 UNIT) PO CAPS: 50000.0000 [IU] | ORAL_CAPSULE | ORAL | 0 refills | Status: DC

## 2018-11-24 MED FILL — BUPROPION HCL SR 200 MG TAB: 200 | 30 days supply | Qty: 30 | Fill #0

## 2018-11-24 MED FILL — VIT D2 1.25 MG (50,000 UNIT: 1.25 MG | 28 days supply | Qty: 4 | Fill #0

## 2018-11-25 NOTE — Progress Notes (Signed)
Office: [phone]  /  Fax: [phone]   HPI:   Chief Complaint: OBESITY [name] is here to discuss her progress with her obesity treatment plan. She is on the keep a food journal with 1600 calories and 90+ grams of protein daily and is following her eating plan approximately 5 % of the time. She states she is exercising 0 minutes 0 times per week. [name] hasn't been journaling much due to increased stress with schoolwork. Increased reward eating.  Her weight is 238 lb (108 kg) today and has gained 3 pounds since her last visit. She has lost 1 lb since starting treatment with [place].  Vitamin D Deficiency [name] has a diagnosis of vitamin D deficiency. She is stable on prescription Vit D. She denies nausea, vomiting or muscle weakness.  At risk for osteopenia and osteoporosis [name] is at higher risk of osteopenia and osteoporosis due to vitamin D deficiency.   Depression with emotional eating behaviors [name] notes increased stress and increased stress and reward eating. She notes its worse in the last 2-3 weeks with increased school stress. [name] struggles with emotional eating and using food for comfort to the extent that it is negatively impacting her health. She often snacks when she is not hungry. [name] sometimes feels she is out of control and then feels guilty that she made poor food choices. She has been working on behavior modification techniques to help reduce her emotional eating and has been somewhat successful. She shows no sign of suicidal or homicidal ideations.  Depression screen [facility] 2/9 10/20/2018 09/23/2018 09/11/2018  Decreased Interest 0 0 3  Down, Depressed, Hopeless 0 0 0  PHQ - 2 Score 0 0 3  Altered sleeping - 0 2  Tired, decreased energy - 1 3  Change in appetite - 1 3  Feeling bad or failure about yourself  - 0 2  Trouble concentrating - 1 3  Moving slowly or fidgety/restless - 0 0  Suicidal thoughts - 0 0  PHQ-9 Score - 3 16  Difficult doing work/chores - - Not  difficult at all    ALLERGIES: No Known Allergies  MEDICATIONS: Current Outpatient Medications on File Prior to Visit  Medication Sig Dispense Refill  . ibuprofen (ADVIL,MOTRIN) 600 MG tablet 1  po  pc every 6 hours for 5 days then as needed for pain 30 tablet 0  . polyethylene glycol (MIRALAX / GLYCOLAX) packet Take 17 g by mouth daily.    [name] Kitchen spironolactone (ALDACTONE) 25 MG tablet TAKE 1 TABLET BY MOUTH EVERY DAY 30 tablet 0   No current facility-administered medications on file prior to visit.     PAST MEDICAL HISTORY: Past Medical History:  Diagnosis Date  . Anxiety   . Constipation   . Constipation   . Joint pain   . Lactose intolerance   . Nervousness   . Obesity   . PCOS (polycystic ovarian syndrome)   . Sickle cell trait ([place])   . Vaginal delivery 1997, 2005, 2011  . Vitamin D deficiency     PAST SURGICAL HISTORY: Past Surgical History:  Procedure Laterality Date  . CERVICAL CERCLAGE    . CYSTOSCOPY  09/19/2016   Procedure: CYSTOSCOPY;  Surgeon: Everett Graff, MD;  Location: [facility];  Service: Gynecology;;  . LAPAROSCOPIC ASSISTED VAGINAL HYSTERECTOMY N/A 09/19/2016   Procedure: LAPAROSCOPIC ASSISTED VAGINAL HYSTERECTOMY Bilateral Sapingectomy;  Surgeon: Everett Graff, MD;  Location: [facility];  Service: Gynecology;  Laterality: N/A;  . TONSILLECTOMY AND ADENOIDECTOMY  2009  SOCIAL HISTORY: Social History   Tobacco Use  . Smoking status: Never Smoker  . Smokeless tobacco: Never Used  Substance Use Topics  . Alcohol use: Yes    Comment: wine  . Drug use: No    FAMILY HISTORY: Family History  Problem Relation Age of Onset  . Hypertension Mother   . Alcoholism Mother   . Drug abuse Mother   . Hyperlipidemia Father   . Alcoholism Father   . Drug abuse Father   . Breast cancer Neg Hx     ROS: Review of Systems  Constitutional: Negative for weight loss.  Gastrointestinal: Negative for nausea and vomiting.  Musculoskeletal:       Negative muscle  weakness  Psychiatric/Behavioral: Positive for depression. Negative for suicidal ideas.    PHYSICAL EXAM: Blood pressure 115/73, pulse 74, temperature 98.4 F (36.9 C), temperature source Oral, height 5\' 6"  (1.676 m), weight 238 lb (108 kg), SpO2 99 %. Body mass index is 38.41 kg/m. Physical Exam  Constitutional: She is oriented to person, place, and time. She appears well-developed and well-nourished.  Cardiovascular: Normal rate.  Pulmonary/Chest: Effort normal.  Musculoskeletal: Normal range of motion.  Neurological: She is oriented to person, place, and time.  Skin: Skin is warm and dry.  Psychiatric: She has a normal mood and affect. Her behavior is normal.  Vitals reviewed.   RECENT LABS AND TESTS: BMET    Component Value Date/Time   NA 138 09/11/2018 1201   K 4.5 09/11/2018 1201   CL 100 09/11/2018 1201   CO2 22 09/11/2018 1201   GLUCOSE 95 09/11/2018 1201   GLUCOSE 80 09/13/2016 0908   GLUCOSE 84 07/27/2010 0505   BUN 13 09/11/2018 1201   CREATININE 0.92 09/11/2018 1201   CALCIUM 9.6 09/11/2018 1201   GFRNONAA 78 09/11/2018 1201   GFRAA 90 09/11/2018 1201   Lab Results  Component Value Date   HGBA1C 5.2 09/11/2018   HGBA1C 5.1 06/12/2018   Lab Results  Component Value Date   INSULIN 9.7 09/11/2018   CBC    Component Value Date/Time   WBC 4.3 09/11/2018 1201   WBC 10.4 09/20/2016 0610   RBC 4.38 09/11/2018 1201   RBC 3.79 (L) 09/20/2016 0610   HGB 13.0 09/11/2018 1201   HCT 39.5 09/11/2018 1201   PLT 286 09/20/2016 0610   MCV 90 09/11/2018 1201   MCH 29.7 09/11/2018 1201   MCH 29.0 09/20/2016 0610   MCHC 32.9 09/11/2018 1201   MCHC 35.0 09/20/2016 0610   RDW 13.7 09/11/2018 1201   LYMPHSABS 1.4 09/11/2018 1201   MONOABS 0.3 07/22/2010 0728   EOSABS 0.1 09/11/2018 1201   BASOSABS 0.0 09/11/2018 1201   Iron/TIBC/Ferritin/ %Sat No results found for: IRON, TIBC, FERRITIN, IRONPCTSAT Lipid Panel     Component Value Date/Time   CHOL 197  09/11/2018 1201   TRIG 91 09/11/2018 1201   HDL 52 09/11/2018 1201   LDLCALC 127 (H) 09/11/2018 1201   Hepatic Function Panel     Component Value Date/Time   PROT 7.4 09/11/2018 1201   ALBUMIN 4.8 09/11/2018 1201   AST 13 09/11/2018 1201   ALT 10 09/11/2018 1201   ALKPHOS 53 09/11/2018 1201   BILITOT 0.4 09/11/2018 1201      Component Value Date/Time   TSH 0.733 09/11/2018 1201   TSH 1.091 05/19/2012 1140  Results for Fogleman, Alanee L (MRN [id number]) as of 11/25/2018 11:27  Ref. Range 09/11/2018 12:01  Vitamin D, 25-Hydroxy Latest Ref Range:  30.0 - 100.0 ng/mL 30.9    ASSESSMENT AND PLAN: Vitamin D deficiency - Plan: Vitamin D, Ergocalciferol, (DRISDOL) 1.25 MG (50000 UT) CAPS capsule  Other depression - with emotional eating - Plan: buPROPion (WELLBUTRIN SR) 200 MG 12 hr tablet  At risk for osteoporosis  Class 2 severe obesity with serious comorbidity and body mass index (BMI) of 38.0 to 38.9 in adult, unspecified obesity type (HCC)  PLAN:  Vitamin D Deficiency [name] was informed that low vitamin D levels contributes to fatigue and are associated with obesity, breast, and colon cancer. [name] agrees to continue taking prescription Vit D @50 ,000 IU every week #4 and we will refill for 1 month. She will follow up for routine testing of vitamin D, at least 2-3 times per year. She was informed of the risk of over-replacement of vitamin D and agrees to not increase her dose unless she discusses this with [place] first. [name] agrees to follow up with our clinic in 2 weeks.  At risk for osteopenia and osteoporosis [name] was given extended (15 minutes) osteoporosis prevention counseling today. [name] is at risk for osteopenia and osteoporsis due to her vitamin D deficiency. She was encouraged to take her vitamin D and follow her higher calcium diet and increase strengthening exercise to help strengthen her bones and decrease her risk of osteopenia and osteoporosis.  Depression with  Emotional Eating Behaviors We discussed behavior modification techniques today to help [name] deal with her emotional eating and depression. [name] agrees to increase Wellbutrin SR to 200 mg q AM #30 with no refills. [name] agrees to follow up with our clinic in 2 weeks.  Obesity [name] is currently in the action stage of change. As such, her goal is to continue with weight loss efforts She has agreed to keep a food journal with 1600 calories and 90+ grams of protein daily [name] has been instructed to work up to a goal of 150 minutes of combined cardio and strengthening exercise per week for weight loss and overall health benefits. We discussed the following Behavioral Modification Strategies today: increasing lean protein intake, work on meal planning and easy cooking plans and emotional eating strategies [name] is to get back to journaling whenever possible. She was rewarded eating strategies.  [name] has agreed to follow up with our clinic in 2 weeks. She was informed of the importance of frequent follow up visits to maximize her success with intensive lifestyle modifications for her multiple health conditions.   OBESITY BEHAVIORAL INTERVENTION VISIT  Today's visit was # 5   Starting weight: 239 lbs Starting date: 09/11/18 Today's weight : 238 lbs  Today's date: 11/24/2018 Total lbs lost to date: 1    ASK: We discussed the diagnosis of obesity with [name] today and [name] agreed to give [place] permission to discuss obesity behavioral modification therapy today.  ASSESS: [name] has the diagnosis of obesity and her BMI today is 38.43 [name] is in the action stage of change   ADVISE: [name] was educated on the multiple health risks of obesity as well as the benefit of weight loss to improve her health. She was advised of the need for long term treatment and the importance of lifestyle modifications to improve her current health and to decrease her risk of future health  problems.  AGREE: Multiple dietary modification options and treatment options were discussed and  [name] agreed to follow the recommendations documented in the above note.  ARRANGE: [name] was educated on the importance of frequent visits  to treat obesity as outlined per CMS and USPSTF guidelines and agreed to schedule her next follow up appointment today.  I, Trixie Dredge, am acting as transcriptionist for Dennard Nip, MD  I have reviewed the above documentation for accuracy and completeness, and I agree with the above. -Dennard Nip, MD

## 2018-12-08 ENCOUNTER — Encounter (INDEPENDENT_AMBULATORY_CARE_PROVIDER_SITE_OTHER): Payer: Self-pay | Admitting: Family Medicine

## 2018-12-08 ENCOUNTER — Ambulatory Visit (INDEPENDENT_AMBULATORY_CARE_PROVIDER_SITE_OTHER): Payer: 59 | Admitting: Family Medicine

## 2018-12-08 VITALS — BP 111/73 | HR 88 | Temp 98.1°F | Ht 66.0 in | Wt 236.0 lb

## 2018-12-08 DIAGNOSIS — Z9189 Other specified personal risk factors, not elsewhere classified: Secondary | ICD-10-CM | POA: Diagnosis not present

## 2018-12-08 DIAGNOSIS — E7849 Other hyperlipidemia: Secondary | ICD-10-CM

## 2018-12-08 DIAGNOSIS — Z6838 Body mass index (BMI) 38.0-38.9, adult: Secondary | ICD-10-CM | POA: Diagnosis not present

## 2018-12-08 DIAGNOSIS — E559 Vitamin D deficiency, unspecified: Secondary | ICD-10-CM

## 2018-12-08 DIAGNOSIS — F3289 Other specified depressive episodes: Secondary | ICD-10-CM | POA: Diagnosis not present

## 2018-12-08 DIAGNOSIS — E8881 Metabolic syndrome: Secondary | ICD-10-CM | POA: Diagnosis not present

## 2018-12-08 MED ORDER — BUPROPION HCL ER (SR) 200 MG PO TB12
200.0000 mg | ORAL_TABLET | Freq: Every day | ORAL | 0 refills | Status: DC
Start: 2018-12-08 — End: 2019-01-05

## 2018-12-08 MED ORDER — VITAMIN D (ERGOCALCIFEROL) 1.25 MG (50000 UNIT) PO CAPS: 50000.0000 [IU] | ORAL_CAPSULE | ORAL | 0 refills | Status: DC

## 2018-12-08 NOTE — Progress Notes (Signed)
Office: [phone]  /  Fax: [phone]   HPI:   Chief Complaint: OBESITY [name] is here to discuss her progress with her obesity treatment plan. She is on the keep a food journal with 1600 calories and 90+ grams of protein daily and is following her eating plan approximately 55 % of the time. She states she is exercising 0 minutes 0 times per week. Lauralyn continues to do well with weight loss and is working on [employer]. Her hunger is controlled but she is struggling to meet her protein goals.  Her weight is 236 lb (107 kg) today and has had a weight loss of 2 pounds over a period of 2 weeks since her last visit. She has lost 3 lbs since starting treatment with [place].  Vitamin D Deficiency Anyi has a diagnosis of vitamin D deficiency. She is stable on prescription Vit D and she is due for labs. She denies nausea, vomiting or muscle weakness.  Insulin Resistance Tegan has a diagnosis of insulin resistance based on her elevated fasting insulin level >5. Although Audryana's blood glucose readings are still under good control, insulin resistance puts her at greater risk of metabolic syndrome and diabetes. She is not taking metformin currently, she is stable on diet and doing well decreasing simple carbohydrates.  At risk for diabetes Trystyn is at higher than average risk for developing diabetes due to her obesity and insulin resistance. She currently denies polyuria or polydipsia.  Hyperlipidemia Nikala has hyperlipidemia and she is attempting to improve her cholesterol levels with intensive lifestyle modification including a low saturated fat diet, exercise and weight loss. She denies any chest pain, claudication or myalgias.  Depression with emotional eating behaviors Kahleah's Wellbutrin increased at her last visit, and she feels the increase is helping with cravings and emotional eating. She denies insomnia. Shandy struggles with emotional eating and using food for comfort to the extent that it  is negatively impacting her health. She often snacks when she is not hungry. Rilee sometimes feels she is out of control and then feels guilty that she made poor food choices. She has been working on behavior modification techniques to help reduce her emotional eating and has been somewhat successful. She shows no sign of suicidal or homicidal ideations.  Depression screen [facility] 2/9 10/20/2018 09/23/2018 09/11/2018  Decreased Interest 0 0 3  Down, Depressed, Hopeless 0 0 0  PHQ - 2 Score 0 0 3  Altered sleeping - 0 2  Tired, decreased energy - 1 3  Change in appetite - 1 3  Feeling bad or failure about yourself  - 0 2  Trouble concentrating - 1 3  Moving slowly or fidgety/restless - 0 0  Suicidal thoughts - 0 0  PHQ-9 Score - 3 16  Difficult doing work/chores - - Not difficult at all    ASSESSMENT AND PLAN:  Vitamin D deficiency - Plan: VITAMIN D 25 Hydroxy (Vit-D Deficiency, Fractures), Vitamin D, Ergocalciferol, (DRISDOL) 1.25 MG (50000 UT) CAPS capsule  Insulin resistance - Plan: Hemoglobin A1c, Insulin, random, Comprehensive metabolic panel  Other hyperlipidemia - Plan: Lipid Panel With LDL/HDL Ratio  Other depression - with emotional eating - Plan: buPROPion (WELLBUTRIN SR) 200 MG 12 hr tablet  At risk for diabetes mellitus  Class 2 severe obesity with serious comorbidity and body mass index (BMI) of 38.0 to 38.9 in adult, unspecified obesity type (HCC)  PLAN:  Vitamin D Deficiency Jannah was informed that low vitamin D levels contributes to fatigue and are  associated with obesity, breast, and colon cancer. Emmagene agrees to continue taking prescription Vit D @50 ,000 IU every week #4 and we will refill for 1 month. She will follow up for routine testing of vitamin D, at least 2-3 times per year. She was informed of the risk of over-replacement of vitamin D and agrees to not increase her dose unless she discusses this with [place] first. We will check labs and Addalee agrees to follow up with  our clinic in 3 weeks.  Insulin Resistance Charmine will continue to work on weight loss, diet, exercise, and decreasing simple carbohydrates in her diet to help decrease the risk of diabetes. We dicussed metformin including benefits and risks. She was informed that eating too many simple carbohydrates or too many calories at one sitting increases the likelihood of GI side effects. [name] declined metformin for now and prescription was not written today. We will check labs and Natash agrees to follow up with our clinic in 3 weeks as directed to monitor her progress.  Diabetes risk counselling Adelfa was given extended (15 minutes) diabetes prevention counseling today. She is 42 y.o. female and has risk factors for diabetes including obesity and insulin resistance. We discussed intensive lifestyle modifications today with an emphasis on weight loss as well as increasing exercise and decreasing simple carbohydrates in her diet.  Hyperlipidemia Saleema was informed of the American Heart Association Guidelines emphasizing intensive lifestyle modifications as the first line treatment for hyperlipidemia. We discussed many lifestyle modifications today in depth, and Kinlie will continue to work on decreasing saturated fats such as fatty red meat, butter and many fried foods. She will also increase vegetables and lean protein in her diet and continue to work on diet, exercise, and weight loss efforts. We will will check labs and Chiyoko agrees to follow up with our clinic in 3 weeks.  Depression with Emotional Eating Behaviors We discussed behavior modification techniques today to help Patrecia deal with her emotional eating and depression. Sariya agrees to continue taking Wellbutrin SR 200 mg qd #30 and we will refill for 1 month. Conna agrees to follow up with our clinic in 3 weeks.  Obesity Alizay is currently in the action stage of change. As such, her goal is to continue with weight loss efforts She has agreed to keep  a food journal with 1600 calories and 90+ grams of protein daily Kent has been instructed to work up to a goal of 150 minutes of combined cardio and strengthening exercise per week for weight loss and overall health benefits. We discussed the following Behavioral Modification Strategies today: increasing lean protein intake and decreasing simple carbohydrates    Onisha has agreed to follow up with our clinic in 3 weeks. She was informed of the importance of frequent follow up visits to maximize her success with intensive lifestyle modifications for her multiple health conditions.  ALLERGIES: No Known Allergies  MEDICATIONS: Current Outpatient Medications on File Prior to Visit  Medication Sig Dispense Refill  . ibuprofen (ADVIL,MOTRIN) 600 MG tablet 1  po  pc every 6 hours for 5 days then as needed for pain 30 tablet 0  . polyethylene glycol (MIRALAX / GLYCOLAX) packet Take 17 g by mouth daily.    Marland Kitchen spironolactone (ALDACTONE) 25 MG tablet TAKE 1 TABLET BY MOUTH EVERY DAY 30 tablet 0   No current facility-administered medications on file prior to visit.     PAST MEDICAL HISTORY: Past Medical History:  Diagnosis Date  . Anxiety   .  Constipation   . Constipation   . Joint pain   . Lactose intolerance   . Nervousness   . Obesity   . PCOS (polycystic ovarian syndrome)   . Sickle cell trait ([place])   . Vaginal delivery 1997, 2005, 2011  . Vitamin D deficiency     PAST SURGICAL HISTORY: Past Surgical History:  Procedure Laterality Date  . CERVICAL CERCLAGE    . CYSTOSCOPY  09/19/2016   Procedure: CYSTOSCOPY;  Surgeon: Everett Graff, MD;  Location: [facility];  Service: Gynecology;;  . LAPAROSCOPIC ASSISTED VAGINAL HYSTERECTOMY N/A 09/19/2016   Procedure: LAPAROSCOPIC ASSISTED VAGINAL HYSTERECTOMY Bilateral Sapingectomy;  Surgeon: Everett Graff, MD;  Location: [facility];  Service: Gynecology;  Laterality: N/A;  . TONSILLECTOMY AND ADENOIDECTOMY  2009    SOCIAL HISTORY: Social History     Tobacco Use  . Smoking status: Never Smoker  . Smokeless tobacco: Never Used  Substance Use Topics  . Alcohol use: Yes    Comment: wine  . Drug use: No    FAMILY HISTORY: Family History  Problem Relation Age of Onset  . Hypertension Mother   . Alcoholism Mother   . Drug abuse Mother   . Hyperlipidemia Father   . Alcoholism Father   . Drug abuse Father   . Breast cancer Neg Hx     ROS: Review of Systems  Constitutional: Positive for weight loss.  Cardiovascular: Negative for chest pain and claudication.  Gastrointestinal: Negative for nausea and vomiting.  Genitourinary: Negative for frequency.  Musculoskeletal: Negative for myalgias.       Negative muscle weakness  Endo/Heme/Allergies: Negative for polydipsia.  Psychiatric/Behavioral: Positive for depression. Negative for suicidal ideas. The patient does not have insomnia.     PHYSICAL EXAM: Blood pressure 111/73, pulse 88, temperature 98.1 F (36.7 C), temperature source Oral, height 5\' 6"  (1.676 m), weight 236 lb (107 kg), SpO2 100 %. Body mass index is 38.09 kg/m. Physical Exam Vitals signs reviewed.  Constitutional:      Appearance: Normal appearance. She is obese.  Cardiovascular:     Rate and Rhythm: Normal rate.     Pulses: Normal pulses.  Pulmonary:     Effort: Pulmonary effort is normal.  Musculoskeletal: Normal range of motion.  Skin:    General: Skin is warm and dry.  Neurological:     Mental Status: She is alert and oriented to person, place, and time.  Psychiatric:        Mood and Affect: Mood normal.        Behavior: Behavior normal.     RECENT LABS AND TESTS: BMET    Component Value Date/Time   NA 138 09/11/2018 1201   K 4.5 09/11/2018 1201   CL 100 09/11/2018 1201   CO2 22 09/11/2018 1201   GLUCOSE 95 09/11/2018 1201   GLUCOSE 80 09/13/2016 0908   GLUCOSE 84 07/27/2010 0505   BUN 13 09/11/2018 1201   CREATININE 0.92 09/11/2018 1201   CALCIUM 9.6 09/11/2018 1201   GFRNONAA 78  09/11/2018 1201   GFRAA 90 09/11/2018 1201   Lab Results  Component Value Date   HGBA1C 5.2 09/11/2018   HGBA1C 5.1 06/12/2018   Lab Results  Component Value Date   INSULIN 9.7 09/11/2018   CBC    Component Value Date/Time   WBC 4.3 09/11/2018 1201   WBC 10.4 09/20/2016 0610   RBC 4.38 09/11/2018 1201   RBC 3.79 (L) 09/20/2016 0610   HGB 13.0 09/11/2018 1201   HCT 39.5  09/11/2018 1201   PLT 286 09/20/2016 0610   MCV 90 09/11/2018 1201   MCH 29.7 09/11/2018 1201   MCH 29.0 09/20/2016 0610   MCHC 32.9 09/11/2018 1201   MCHC 35.0 09/20/2016 0610   RDW 13.7 09/11/2018 1201   LYMPHSABS 1.4 09/11/2018 1201   MONOABS 0.3 07/22/2010 0728   EOSABS 0.1 09/11/2018 1201   BASOSABS 0.0 09/11/2018 1201   Iron/TIBC/Ferritin/ %Sat No results found for: IRON, TIBC, FERRITIN, IRONPCTSAT Lipid Panel     Component Value Date/Time   CHOL 197 09/11/2018 1201   TRIG 91 09/11/2018 1201   HDL 52 09/11/2018 1201   LDLCALC 127 (H) 09/11/2018 1201   Hepatic Function Panel     Component Value Date/Time   PROT 7.4 09/11/2018 1201   ALBUMIN 4.8 09/11/2018 1201   AST 13 09/11/2018 1201   ALT 10 09/11/2018 1201   ALKPHOS 53 09/11/2018 1201   BILITOT 0.4 09/11/2018 1201      Component Value Date/Time   TSH 0.733 09/11/2018 1201   TSH 1.091 05/19/2012 1140      OBESITY BEHAVIORAL INTERVENTION VISIT  Today's visit was # 6   Starting weight: 239 lbs Starting date: 09/11/18 Today's weight : 236 lbs  Today's date: 12/08/2018 Total lbs lost to date: 3    ASK: We discussed the diagnosis of obesity with [name] today and Shyana agreed to give [place] permission to discuss obesity behavioral modification therapy today.  ASSESS: Quetzally has the diagnosis of obesity and her BMI today is 38.11 Tenecia is in the action stage of change   ADVISE: Daleah was educated on the multiple health risks of obesity as well as the benefit of weight loss to improve her health. She was advised of the  need for long term treatment and the importance of lifestyle modifications to improve her current health and to decrease her risk of future health problems.  AGREE: Multiple dietary modification options and treatment options were discussed and  Lenah agreed to follow the recommendations documented in the above note.  ARRANGE: Mililani was educated on the importance of frequent visits to treat obesity as outlined per CMS and USPSTF guidelines and agreed to schedule her next follow up appointment today.  I, Trixie Dredge, am acting as transcriptionist for Dennard Nip, MD  I have reviewed the above documentation for accuracy and completeness, and I agree with the above. -Dennard Nip, MD

## 2018-12-09 LAB — VITAMIN D 25 HYDROXY (VIT D DEFICIENCY, FRACTURES): Vit D, 25-Hydroxy: 36.9 ng/mL (ref 30.0–100.0)

## 2018-12-09 LAB — LIPID PANEL WITH LDL/HDL RATIO
Cholesterol, Total: 194 mg/dL (ref 100–199)
HDL: 59 mg/dL (ref >39)
LDL CALC: 119 mg/dL — AB (ref 0–99)
LDL/HDL RATIO: 2 ratio (ref 0.0–3.2)
Triglycerides: 78 mg/dL (ref 0–149)
VLDL Cholesterol Cal: 16 mg/dL (ref 5–40)

## 2018-12-09 LAB — COMPREHENSIVE METABOLIC PANEL
ALK PHOS: 54 IU/L (ref 39–117)
ALT: 11 IU/L (ref 0–32)
AST: 14 IU/L (ref 0–40)
Albumin/Globulin Ratio: 1.7 (ref 1.2–2.2)
Albumin: 4.4 g/dL (ref 3.5–5.5)
BILIRUBIN TOTAL: 0.4 mg/dL (ref 0.0–1.2)
BUN/Creatinine Ratio: 14 (ref 9–23)
BUN: 13 mg/dL (ref 6–24)
CHLORIDE: 103 mmol/L (ref 96–106)
CO2: 22 mmol/L (ref 20–29)
Calcium: 9.3 mg/dL (ref 8.7–10.2)
Creatinine, Ser: 0.93 mg/dL (ref 0.57–1.00)
GFR calc Af Amer: 89 mL/min/{1.73_m2} (ref >59)
GFR calc non Af Amer: 77 mL/min/{1.73_m2} (ref >59)
GLOBULIN, TOTAL: 2.6 g/dL (ref 1.5–4.5)
GLUCOSE: 101 mg/dL — AB (ref 65–99)
Potassium: 4.4 mmol/L (ref 3.5–5.2)
SODIUM: 138 mmol/L (ref 134–144)
Total Protein: 7 g/dL (ref 6.0–8.5)

## 2018-12-09 LAB — INSULIN, RANDOM: INSULIN: 9 u[IU]/mL (ref 2.6–24.9)

## 2018-12-09 LAB — HEMOGLOBIN A1C
Est. average glucose Bld gHb Est-mCnc: 97 mg/dL
HEMOGLOBIN A1C: 5 % (ref 4.8–5.6)

## 2018-12-22 MED FILL — BUPROPION HCL SR 200 MG TAB: 200 | 30 days supply | Qty: 30 | Fill #0

## 2018-12-22 MED FILL — VIT D2 1.25 MG (50,000 UNIT: 1.25 MG | 28 days supply | Qty: 4 | Fill #0

## 2018-12-27 ENCOUNTER — Encounter (INDEPENDENT_AMBULATORY_CARE_PROVIDER_SITE_OTHER): Payer: Self-pay | Admitting: Family Medicine

## 2018-12-29 NOTE — Telephone Encounter (Signed)
Please address

## 2018-12-30 ENCOUNTER — Encounter (INDEPENDENT_AMBULATORY_CARE_PROVIDER_SITE_OTHER): Payer: Self-pay

## 2018-12-30 ENCOUNTER — Ambulatory Visit (INDEPENDENT_AMBULATORY_CARE_PROVIDER_SITE_OTHER): Payer: Self-pay | Admitting: Family Medicine

## 2019-01-05 ENCOUNTER — Ambulatory Visit (INDEPENDENT_AMBULATORY_CARE_PROVIDER_SITE_OTHER): Payer: 59 | Admitting: Family Medicine

## 2019-01-05 ENCOUNTER — Encounter (INDEPENDENT_AMBULATORY_CARE_PROVIDER_SITE_OTHER): Payer: Self-pay | Admitting: Family Medicine

## 2019-01-05 VITALS — BP 132/72 | HR 86 | Ht 66.0 in | Wt 239.0 lb

## 2019-01-05 DIAGNOSIS — E559 Vitamin D deficiency, unspecified: Secondary | ICD-10-CM

## 2019-01-05 DIAGNOSIS — F3289 Other specified depressive episodes: Secondary | ICD-10-CM

## 2019-01-05 DIAGNOSIS — Z6838 Body mass index (BMI) 38.0-38.9, adult: Secondary | ICD-10-CM

## 2019-01-05 DIAGNOSIS — Z9189 Other specified personal risk factors, not elsewhere classified: Secondary | ICD-10-CM

## 2019-01-06 MED ORDER — BUPROPION HCL ER (SR) 200 MG PO TB12: 200.0000 mg | ORAL_TABLET | Freq: Every day | ORAL | 0 refills | Status: DC

## 2019-01-06 MED ORDER — VITAMIN D (ERGOCALCIFEROL) 1.25 MG (50000 UNIT) PO CAPS: 50000.0000 [IU] | ORAL_CAPSULE | ORAL | 0 refills | Status: DC

## 2019-01-06 NOTE — Progress Notes (Signed)
Office: [phone]  /  Fax: [phone]   HPI:   Chief Complaint: OBESITY [name] is here to discuss her progress with her obesity treatment plan. She is on the keep a food journal with 1600 calories and 90+ grams of protein daily and is following her eating plan approximately 50 % of the time. She states she is exercising 0 minutes 0 times per week. [name] indulged over the holidays and she feels somewhat guilty about this. She states she is ready to get back on track, but she is struggling with self-sabotage.  Her weight is 239 lb (108.4 kg) today and has gained 3 pounds since her last visit. She has lost 0 lbs since starting treatment with [place].  Vitamin D Deficiency [name] has a diagnosis of vitamin D deficiency. She is stable on prescription Vit D, but level is not yet at goal. She denies nausea, vomiting or muscle weakness.  Depression with emotional eating behaviors [name]'s mood is stable on Wellbutrin. She feels it has helped but she is still struggling with motivation. She seen Dr. Mallie Mussel 1 time, but was unable to keep her follow up appointment. Yoshie struggles with emotional eating and using food for comfort to the extent that it is negatively impacting her health. She often snacks when she is not hungry. [name] sometimes feels she is out of control and then feels guilty that she made poor food choices. She has been working on behavior modification techniques to help reduce her emotional eating and has been somewhat successful. She shows no sign of suicidal or homicidal ideations.  Depression screen [name] 2/9 10/20/2018 09/23/2018 09/11/2018  Decreased Interest 0 0 3  Down, Depressed, Hopeless 0 0 0  PHQ - 2 Score 0 0 3  Altered sleeping - 0 2  Tired, decreased energy - 1 3  Change in appetite - 1 3  Feeling bad or failure about yourself  - 0 2  Trouble concentrating - 1 3  Moving slowly or fidgety/restless - 0 0  Suicidal thoughts - 0 0  PHQ-9 Score - 3 16  Difficult doing  work/chores - - Not difficult at all    At risk for cardiovascular disease Malajah is at a higher than average risk for cardiovascular disease due to obesity. She currently denies any chest pain.  ASSESSMENT AND PLAN:  Vitamin D deficiency - Plan: Vitamin D, Ergocalciferol, (DRISDOL) 1.25 MG (50000 UT) CAPS capsule  Other depression - with emotional eating - Plan: buPROPion (WELLBUTRIN SR) 200 MG 12 hr tablet  At risk for heart disease  Class 2 severe obesity with serious comorbidity and body mass index (BMI) of 38.0 to 38.9 in adult, unspecified obesity type ([name])  PLAN:  Vitamin D Deficiency [name] was informed that low vitamin D levels contributes to fatigue and are associated with obesity, breast, and colon cancer. Kinzey agrees to continue taking prescription Vit D @50 ,000 IU every week #4 and we will refill for 1 month. She will follow up for routine testing of vitamin D, at least 2-3 times per year. She was informed of the risk of over-replacement of vitamin D and agrees to not increase her dose unless she discusses this with [place] first. Alanie agrees to follow up with our clinic in 2 to 3 weeks.  Depression with Emotional Eating Behaviors We discussed behavior modification techniques today to help [name] deal with her emotional eating and depression. [name] agrees to continue taking Wellbutrin SR 200 mg qd #30 and we will refill for 1  month. [name] agrees to follow up with our clinic in 2 to 3 weeks with myself and Dr. Mallie Mussel.  Cardiovascular risk counselling [name] was given extended (15 minutes) coronary artery disease prevention counseling today. She is 43 y.o. female and has risk factors for heart disease including obesity. We discussed intensive lifestyle modifications today with an emphasis on specific weight loss instructions and strategies. Pt was also informed of the importance of increasing exercise and decreasing saturated fats to help prevent heart disease.  Obesity [name] is  currently in the action stage of change. As such, her goal is to continue with weight loss efforts She has agreed to keep a food journal with 1600 calories and 90+ grams of protein daily [name] has been instructed to work up to a goal of 150 minutes of combined cardio and strengthening exercise per week for weight loss and overall health benefits. We discussed the following Behavioral Modification Strategies today: increasing lean protein intake, decreasing simple carbohydrates, work on meal planning and easy cooking plans, dealing with family or coworker sabotage, better snacking choices, and keep a strict food journal   [name] has agreed to follow up with our clinic in 2 to 3 weeks with myself and Dr. Mallie Mussel. She was informed of the importance of frequent follow up visits to maximize her success with intensive lifestyle modifications for her multiple health conditions.  ALLERGIES: No Known Allergies  MEDICATIONS: Current Outpatient Medications on File Prior to Visit  Medication Sig Dispense Refill  . ibuprofen (ADVIL,MOTRIN) 600 MG tablet 1  po  pc every 6 hours for 5 days then as needed for pain 30 tablet 0  . polyethylene glycol (MIRALAX / GLYCOLAX) packet Take 17 g by mouth daily.     No current facility-administered medications on file prior to visit.     PAST MEDICAL HISTORY: Past Medical History:  Diagnosis Date  . Anxiety   . Constipation   . Constipation   . Joint pain   . Lactose intolerance   . Nervousness   . Obesity   . PCOS (polycystic ovarian syndrome)   . Sickle cell trait ([place])   . Vaginal delivery 1997, 2005, 2011  . Vitamin D deficiency     PAST SURGICAL HISTORY: Past Surgical History:  Procedure Laterality Date  . CERVICAL CERCLAGE    . CYSTOSCOPY  09/19/2016   Procedure: CYSTOSCOPY;  Surgeon: Emma Graff, MD;  Location: [facility];  Service: Gynecology;;  . LAPAROSCOPIC ASSISTED VAGINAL HYSTERECTOMY N/A 09/19/2016   Procedure: LAPAROSCOPIC ASSISTED VAGINAL  HYSTERECTOMY Bilateral Sapingectomy;  Surgeon: Emma Graff, MD;  Location: [facility];  Service: Gynecology;  Laterality: N/A;  . TONSILLECTOMY AND ADENOIDECTOMY  2009    SOCIAL HISTORY: Social History   Tobacco Use  . Smoking status: Never Smoker  . Smokeless tobacco: Never Used  Substance Use Topics  . Alcohol use: Yes    Comment: wine  . Drug use: No    FAMILY HISTORY: Family History  Problem Relation Age of Onset  . Hypertension Mother   . Alcoholism Mother   . Drug abuse Mother   . Hyperlipidemia Father   . Alcoholism Father   . Drug abuse Father   . Breast cancer Neg Hx     ROS: Review of Systems  Constitutional: Negative for weight loss.  Cardiovascular: Negative for chest pain.  Gastrointestinal: Negative for nausea and vomiting.  Musculoskeletal:       Negative muscle weakness  Psychiatric/Behavioral: Positive for depression. Negative for suicidal ideas.  PHYSICAL EXAM: Blood pressure 132/72, pulse 86, height 5\' 6"  (1.676 m), weight 239 lb (108.4 kg), SpO2 98 %. Body mass index is 38.58 kg/m. Physical Exam Vitals signs reviewed.  Constitutional:      Appearance: Normal appearance. She is obese.  Cardiovascular:     Rate and Rhythm: Normal rate.     Pulses: Normal pulses.  Pulmonary:     Effort: Pulmonary effort is normal.     Breath sounds: Normal breath sounds.  Musculoskeletal: Normal range of motion.  Skin:    General: Skin is warm and dry.  Neurological:     Mental Status: She is alert and oriented to person, place, and time.  Psychiatric:        Mood and Affect: Mood normal.        Behavior: Behavior normal.     RECENT LABS AND TESTS: BMET    Component Value Date/Time   NA 138 12/08/2018 1002   K 4.4 12/08/2018 1002   CL 103 12/08/2018 1002   CO2 22 12/08/2018 1002   GLUCOSE 101 (H) 12/08/2018 1002   GLUCOSE 80 09/13/2016 0908   GLUCOSE 84 07/27/2010 0505   BUN 13 12/08/2018 1002   CREATININE 0.93 12/08/2018 1002   CALCIUM 9.3  12/08/2018 1002   GFRNONAA 77 12/08/2018 1002   GFRAA 89 12/08/2018 1002   Lab Results  Component Value Date   HGBA1C 5.0 12/08/2018   HGBA1C 5.2 09/11/2018   HGBA1C 5.1 06/12/2018   Lab Results  Component Value Date   INSULIN 9.0 12/08/2018   INSULIN 9.7 09/11/2018   CBC    Component Value Date/Time   WBC 4.3 09/11/2018 1201   WBC 10.4 09/20/2016 0610   RBC 4.38 09/11/2018 1201   RBC 3.79 (L) 09/20/2016 0610   HGB 13.0 09/11/2018 1201   HCT 39.5 09/11/2018 1201   PLT 286 09/20/2016 0610   MCV 90 09/11/2018 1201   MCH 29.7 09/11/2018 1201   MCH 29.0 09/20/2016 0610   MCHC 32.9 09/11/2018 1201   MCHC 35.0 09/20/2016 0610   RDW 13.7 09/11/2018 1201   LYMPHSABS 1.4 09/11/2018 1201   MONOABS 0.3 07/22/2010 0728   EOSABS 0.1 09/11/2018 1201   BASOSABS 0.0 09/11/2018 1201   Iron/TIBC/Ferritin/ %Sat No results found for: IRON, TIBC, FERRITIN, IRONPCTSAT Lipid Panel     Component Value Date/Time   CHOL 194 12/08/2018 1002   TRIG 78 12/08/2018 1002   HDL 59 12/08/2018 1002   LDLCALC 119 (H) 12/08/2018 1002   Hepatic Function Panel     Component Value Date/Time   PROT 7.0 12/08/2018 1002   ALBUMIN 4.4 12/08/2018 1002   AST 14 12/08/2018 1002   ALT 11 12/08/2018 1002   ALKPHOS 54 12/08/2018 1002   BILITOT 0.4 12/08/2018 1002      Component Value Date/Time   TSH 0.733 09/11/2018 1201   TSH 1.091 05/19/2012 1140      OBESITY BEHAVIORAL INTERVENTION VISIT  Today's visit was # 8   Starting weight: 239 lbs Starting date: 09/11/18 Today's weight : 239 lbs Today's date: 01/05/2019 Total lbs lost to date: 0    ASK: We discussed the diagnosis of obesity with [name] today and [name] agreed to give [place] permission to discuss obesity behavioral modification therapy today.  ASSESS: [name] has the diagnosis of obesity and her BMI today is 38.59 [name] is in the action stage of change   ADVISE: [name] was educated on the multiple health risks of obesity as  well as the benefit of weight loss to improve her health. She was advised of the need for long term treatment and the importance of lifestyle modifications to improve her current health and to decrease her risk of future health problems.  AGREE: Multiple dietary modification options and treatment options were discussed and  [name] agreed to follow the recommendations documented in the above note.  ARRANGE: [name] was educated on the importance of frequent visits to treat obesity as outlined per CMS and USPSTF guidelines and agreed to schedule her next follow up appointment today.  I, [name], am acting as transcriptionist for Dennard Nip, MD  I have reviewed the above documentation for accuracy and completeness, and I agree with the above. -Dennard Nip, MD

## 2019-01-19 MED FILL — VIT D2 1.25 MG (50,000 UNIT: 1.25 MG | 28 days supply | Qty: 4 | Fill #0

## 2019-01-26 ENCOUNTER — Encounter (INDEPENDENT_AMBULATORY_CARE_PROVIDER_SITE_OTHER): Payer: Self-pay | Admitting: Family Medicine

## 2019-01-26 ENCOUNTER — Ambulatory Visit (INDEPENDENT_AMBULATORY_CARE_PROVIDER_SITE_OTHER): Payer: 59 | Admitting: Psychology

## 2019-01-26 ENCOUNTER — Ambulatory Visit (INDEPENDENT_AMBULATORY_CARE_PROVIDER_SITE_OTHER): Payer: 59 | Admitting: Family Medicine

## 2019-01-26 VITALS — BP 117/78 | HR 81 | Ht 66.0 in | Wt 236.0 lb

## 2019-01-26 DIAGNOSIS — Z9189 Other specified personal risk factors, not elsewhere classified: Secondary | ICD-10-CM

## 2019-01-26 DIAGNOSIS — E559 Vitamin D deficiency, unspecified: Secondary | ICD-10-CM

## 2019-01-26 DIAGNOSIS — F418 Other specified anxiety disorders: Secondary | ICD-10-CM

## 2019-01-26 DIAGNOSIS — Z6838 Body mass index (BMI) 38.0-38.9, adult: Secondary | ICD-10-CM

## 2019-01-26 MED ORDER — VITAMIN D (ERGOCALCIFEROL) 1.25 MG (50000 UNIT) PO CAPS: 50000.0000 [IU] | ORAL_CAPSULE | ORAL | 0 refills | Status: DC

## 2019-01-26 NOTE — Progress Notes (Signed)
Office: [phone]  /  Fax: [phone]    Date: January 26, 2019   Time Seen: 4:00pm Duration: 30 minutes Provider: Glennie Isle, Psy.D. Type of Session: Individual Therapy  Type of Contact: Face-to-face  Session Content: [name] is a 43 y.o. female presenting for a follow-up appointment to address the previously established treatment goal of decreasing emotional eating. The session was initiated with the administration of the PHQ-9 and GAD-7, as well as a brief check-in. [name] discussed she is in her second semester of graduate school, and is working full-time. She noted experiencing stress secondary to the aforementioned. This provider discussed the importance of consistency in regard to therapy appointments. [name] verbally acknowledged understanding, and added, "I need it." Regarding eating, [name] noted, "That needs a little growth." She explained Wellbutrin is helping with cravings. However, since the last appointment with this provider, [name] shared she has noticed she engages in emotional eating when she is working from home and studying. Psychoeducation regarding triggers for emotional eating was provided. [name] was provided a handout, and encouraged to utilize the handout between now and the next appointment to increase awareness of triggers and frequency. [name] agreed. This provider also discussed behavioral strategies for specific triggers, such as placing the utensil down when conversing to avoid mindless eating. [name] was receptive to today's session as evidenced by openness to sharing, responsiveness to feedback, and willingness to identify triggers for emotional eating.   Mental Status Examination: [name] arrived on time for the appointment. She presented as appropriately dressed and groomed. [name] appeared her stated age and demonstrated adequate orientation to time, place, person, and purpose of the appointment. She also demonstrated appropriate eye contact. No psychomotor  abnormalities or behavioral peculiarities noted. Her mood was euthymic with congruent affect. Her thought processes were logical, linear, and goal-directed. No hallucinations, delusions, bizarre thinking or behavior reported or observed. Judgment, insight, and impulse control appeared to be grossly intact. There was no evidence of paraphasias (i.e., errors in speech, gross mispronunciations, and word substitutions), repetition deficits, or disturbances in volume or prosody (i.e., rhythm and intonation). There was no evidence of attention or memory impairments. [name] denied current suicidal and homicidal ideation, plan and intent.   Structured Assessment Results: The Patient Health Questionnaire-9 (PHQ-9) is a self-report measure that assesses symptoms and severity of depression over the course of the last two weeks. [name] obtained a score of 4 suggesting minimal depression. [name] finds the endorsed symptoms to be somewhat difficult. Depression screen [facility] 2/9 01/26/2019  Decreased Interest 0  Down, Depressed, Hopeless 1  PHQ - 2 Score 1  Altered sleeping 0  Tired, decreased energy 1  Change in appetite 1  Feeling bad or failure about yourself  0  Trouble concentrating 1  Moving slowly or fidgety/restless 0  Suicidal thoughts 0  PHQ-9 Score 4  Difficult doing work/chores -   The Generalized Anxiety Disorder-7 (GAD-7) is a brief self-report measure that assesses symptoms of anxiety over the course of the last two weeks. [name] obtained a score of 11 suggesting moderate anxiety. GAD 7 : Generalized Anxiety Score 01/26/2019  Nervous, Anxious, on Edge 2  Control/stop worrying 2  Worry too much - different things 2  Trouble relaxing 2  Restless 1  Easily annoyed or irritable 1  Afraid - awful might happen 1  Total GAD 7 Score 11  Anxiety Difficulty Somewhat difficult   Interventions:  Administration of PHQ-9 and GAD-7 for symptom monitoring Review of content from the previous session Empathic  reflections  and validation Psychoeducation regarding triggers for emotional eating Rapport building Brief chart review  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 Progress is limited, as [name] last met with this provider for an initial appointment in October of 2019; however, she is receptive to the interaction and interventions and rapport is being established. During today's appointment, she demonstrated willingness to identify her triggers for emotional eating.  Plan: [name] continues to appear able and willing to participate as evidenced by engagement in reciprocal conversation, and asking questions for clarification as appropriate. The next appointment will be scheduled in two weeks. The next session will focus on reviewing triggers for emotional eating, and working towards the established treatment goal.

## 2019-01-26 NOTE — Progress Notes (Signed)
Office: [phone]  /  Fax: [phone]   HPI:   Chief Complaint: OBESITY [name] is here to discuss her progress with her obesity treatment plan. She is keeping a food journal with 1600 calories and 90 grams of protein  and is following her eating plan approximately 65 % of the time. She states she is exercising 0 minutes 0 times per week. [name] continues to do well with weight loss and is working on meeting her protein goal, but she has been using protein shakes to help.  Her weight is 236 lb (107 kg) today and has had a weight loss of 3 pounds over a period of 3 weeks since her last visit. She has lost 3  lbs since starting treatment with [place].  Vitamin D deficiency [name] has a diagnosis of vitamin D deficiency. She is currently stable on vit D, but is not yet at goal. She denies nausea, vomiting, or muscle weakness.  At risk for osteopenia and osteoporosis [name] is at higher risk of osteopenia and osteoporosis due to vitamin D deficiency.   ASSESSMENT AND PLAN:  Vitamin D deficiency - Plan: Vitamin D, Ergocalciferol, (DRISDOL) 1.25 MG (50000 UT) CAPS capsule  At risk for osteoporosis  Class 2 severe obesity with serious comorbidity and body mass index (BMI) of 38.0 to 38.9 in adult, unspecified obesity type (HCC)  PLAN:  Vitamin D Deficiency [name] was informed that low vitamin D levels contributes to fatigue and are associated with obesity, breast, and colon cancer. She agrees to continue to take prescription Vit D @50 ,000 IU every week #4 with no refills and will follow up for routine testing of vitamin D, at least 2-3 times per year. She was informed of the risk of over-replacement of vitamin D and agrees to not increase her dose unless she discusses this with [place] first. [name] agrees to follow up in 2 to 3 weeks.  At risk for osteopenia and osteoporosis [name] was given extended (15 minutes) osteoporosis prevention counseling today. [name] is at risk for osteopenia and  osteoporosis due to her vitamin D deficiency. She was encouraged to take her vitamin D and follow her higher calcium diet and increase strengthening exercise to help strengthen her bones and decrease her risk of osteopenia and osteoporosis.  Obesity [name] is currently in the action stage of change. As such, her goal is to continue with weight loss efforts. She has agreed to keep a food journal with 1600 calories and 90+ grams of protein.  [name] has been instructed to work up to a goal of 150 minutes of combined cardio and strengthening exercise per week for weight loss and overall health benefits. We discussed the following Behavioral Modification Strategies today: increasing lean protein intake, decreasing simple carbohydrates, and work on meal planning and easy cooking plans.  [name] has agreed to follow up with our clinic in 2 to 3 weeks. She was informed of the importance of frequent follow up visits to maximize her success with intensive lifestyle modifications for her multiple health conditions.  ALLERGIES: No Known Allergies  MEDICATIONS: Current Outpatient Medications on File Prior to Visit  Medication Sig Dispense Refill  . buPROPion (WELLBUTRIN SR) 200 MG 12 hr tablet Take 1 tablet (200 mg total) by mouth daily. 30 tablet 0  . ibuprofen (ADVIL,MOTRIN) 600 MG tablet 1  po  pc every 6 hours for 5 days then as needed for pain 30 tablet 0  . polyethylene glycol (MIRALAX / GLYCOLAX) packet Take 17 g by mouth  daily.     No current facility-administered medications on file prior to visit.     PAST MEDICAL HISTORY: Past Medical History:  Diagnosis Date  . Anxiety   . Constipation   . Constipation   . Joint pain   . Lactose intolerance   . Nervousness   . Obesity   . PCOS (polycystic ovarian syndrome)   . Sickle cell trait ([place])   . Vaginal delivery 1997, 2005, 2011  . Vitamin D deficiency     PAST SURGICAL HISTORY: Past Surgical History:  Procedure Laterality Date  .  CERVICAL CERCLAGE    . CYSTOSCOPY  09/19/2016   Procedure: CYSTOSCOPY;  Surgeon: Everett Graff, MD;  Location: [facility];  Service: Gynecology;;  . LAPAROSCOPIC ASSISTED VAGINAL HYSTERECTOMY N/A 09/19/2016   Procedure: LAPAROSCOPIC ASSISTED VAGINAL HYSTERECTOMY Bilateral Sapingectomy;  Surgeon: Everett Graff, MD;  Location: [facility];  Service: Gynecology;  Laterality: N/A;  . TONSILLECTOMY AND ADENOIDECTOMY  2009    SOCIAL HISTORY: Social History   Tobacco Use  . Smoking status: Never Smoker  . Smokeless tobacco: Never Used  Substance Use Topics  . Alcohol use: Yes    Comment: wine  . Drug use: No    FAMILY HISTORY: Family History  Problem Relation Age of Onset  . Hypertension Mother   . Alcoholism Mother   . Drug abuse Mother   . Hyperlipidemia Father   . Alcoholism Father   . Drug abuse Father   . Breast cancer Neg Hx     ROS: Review of Systems  Constitutional: Positive for weight loss.  Gastrointestinal: Negative for nausea and vomiting.  Musculoskeletal:       Negative for muscle weakness.    PHYSICAL EXAM: Blood pressure 117/78, pulse 81, height 5\' 6"  (1.676 m), weight 236 lb (107 kg), SpO2 100 %. Body mass index is 38.09 kg/m. Physical Exam Vitals signs reviewed.  Constitutional:      Appearance: Normal appearance. She is obese.  Cardiovascular:     Rate and Rhythm: Normal rate.  Pulmonary:     Effort: Pulmonary effort is normal.  Musculoskeletal: Normal range of motion.  Skin:    General: Skin is warm and dry.  Neurological:     Mental Status: She is alert and oriented to person, place, and time.  Psychiatric:        Mood and Affect: Mood normal.        Behavior: Behavior normal.     RECENT LABS AND TESTS: BMET    Component Value Date/Time   NA 138 12/08/2018 1002   K 4.4 12/08/2018 1002   CL 103 12/08/2018 1002   CO2 22 12/08/2018 1002   GLUCOSE 101 (H) 12/08/2018 1002   GLUCOSE 80 09/13/2016 0908   GLUCOSE 84 07/27/2010 0505   BUN 13  12/08/2018 1002   CREATININE 0.93 12/08/2018 1002   CALCIUM 9.3 12/08/2018 1002   GFRNONAA 77 12/08/2018 1002   GFRAA 89 12/08/2018 1002   Lab Results  Component Value Date   HGBA1C 5.0 12/08/2018   HGBA1C 5.2 09/11/2018   HGBA1C 5.1 06/12/2018   Lab Results  Component Value Date   INSULIN 9.0 12/08/2018   INSULIN 9.7 09/11/2018   CBC    Component Value Date/Time   WBC 4.3 09/11/2018 1201   WBC 10.4 09/20/2016 0610   RBC 4.38 09/11/2018 1201   RBC 3.79 (L) 09/20/2016 0610   HGB 13.0 09/11/2018 1201   HCT 39.5 09/11/2018 1201   PLT 286 09/20/2016 0610  MCV 90 09/11/2018 1201   MCH 29.7 09/11/2018 1201   MCH 29.0 09/20/2016 0610   MCHC 32.9 09/11/2018 1201   MCHC 35.0 09/20/2016 0610   RDW 13.7 09/11/2018 1201   LYMPHSABS 1.4 09/11/2018 1201   MONOABS 0.3 07/22/2010 0728   EOSABS 0.1 09/11/2018 1201   BASOSABS 0.0 09/11/2018 1201   Iron/TIBC/Ferritin/ %Sat No results found for: IRON, TIBC, FERRITIN, IRONPCTSAT Lipid Panel     Component Value Date/Time   CHOL 194 12/08/2018 1002   TRIG 78 12/08/2018 1002   HDL 59 12/08/2018 1002   LDLCALC 119 (H) 12/08/2018 1002   Hepatic Function Panel     Component Value Date/Time   PROT 7.0 12/08/2018 1002   ALBUMIN 4.4 12/08/2018 1002   AST 14 12/08/2018 1002   ALT 11 12/08/2018 1002   ALKPHOS 54 12/08/2018 1002   BILITOT 0.4 12/08/2018 1002      Component Value Date/Time   TSH 0.733 09/11/2018 1201   TSH 1.091 05/19/2012 1140   Results for Edelen, Madeleyn L (MRN [id number]) as of 01/26/2019 17:38  Ref. Range 12/08/2018 10:02  Vitamin D, 25-Hydroxy Latest Ref Range: 30.0 - 100.0 ng/mL 36.9    OBESITY BEHAVIORAL INTERVENTION VISIT  Today's visit was # 8   Starting weight: 239 lbs Starting date: 09/11/18 Today's weight : Weight: 236 lb (107 kg)  Today's date: 01/26/2019 Total lbs lost to date: 3  ASK: We discussed the diagnosis of obesity with [name] today and [name] agreed to give [place] permission to  discuss obesity behavioral modification therapy today.  ASSESS: [name] has the diagnosis of obesity and her BMI today is 38.1. [name] is in the action stage of change.   ADVISE: [name] was educated on the multiple health risks of obesity as well as the benefit of weight loss to improve her health. She was advised of the need for long term treatment and the importance of lifestyle modifications to improve her current health and to decrease her risk of future health problems.  AGREE: Multiple dietary modification options and treatment options were discussed and [name] agreed to follow the recommendations documented in the above note.  ARRANGE: [name] was educated on the importance of frequent visits to treat obesity as outlined per CMS and USPSTF guidelines and agreed to schedule her next follow up appointment today.  IMarcille Blanco, CMA, am acting as transcriptionist for Starlyn Skeans, MD  I have reviewed the above documentation for accuracy and completeness, and I agree with the above. -Dennard Nip, MD

## 2019-02-02 MED FILL — VIT D2 1.25 MG (50,000 UNIT: 1.25 MG | 28 days supply | Qty: 4 | Fill #0

## 2019-02-02 MED FILL — BUPROPION HCL SR 200 MG TAB: 200 | 30 days supply | Qty: 30 | Fill #0

## 2019-02-09 ENCOUNTER — Encounter (INDEPENDENT_AMBULATORY_CARE_PROVIDER_SITE_OTHER): Payer: Self-pay

## 2019-02-09 ENCOUNTER — Ambulatory Visit (INDEPENDENT_AMBULATORY_CARE_PROVIDER_SITE_OTHER): Payer: 59 | Admitting: Psychology

## 2019-02-09 NOTE — Progress Notes (Unsigned)
  Office: [phone]  /  Fax: [phone]    Date: February 09, 2019   Time Seen:*** Duration:*** Provider: Glennie Isle, Psy.D. Type of Session: Individual Therapy  Type of Contact: Face-to-face  Session Content: [name] is a 43 y.o. female presenting for a follow-up appointment to address the previously established treatment goal of decreasing emotional eating. The session was initiated with the administration of the PHQ-9 and GAD-7, as well as a brief check-in. *** [name] was receptive to today's session as evidenced by openness to sharing, responsiveness to feedback, and ***.  Mental Status Examination: [name] arrived on time for the appointment. She presented as appropriately dressed and groomed. [name] appeared her stated age and demonstrated adequate orientation to time, place, person, and purpose of the appointment. She also demonstrated appropriate eye contact. No psychomotor abnormalities or behavioral peculiarities noted. Her mood was {gbmood:21757} with congruent affect. Her thought processes were logical, linear, and goal-directed. No hallucinations, delusions, bizarre thinking or behavior reported or observed. Judgment, insight, and impulse control appeared to be grossly intact. There was no evidence of paraphasias (i.e., errors in speech, gross mispronunciations, and word substitutions), repetition deficits, or disturbances in volume or prosody (i.e., rhythm and intonation). There was no evidence of attention or memory impairments. [name] denied current suicidal and homicidal ideation, plan and intent.   Structured Assessment Results: The Patient Health Questionnaire-9 (PHQ-9) is a self-report measure that assesses symptoms and severity of depression over the course of the last two weeks. [name] obtained a score of *** suggesting {GBPHQ9SEVERITY:21752}. [name] finds the endorsed symptoms to be {gbphq9difficulty:21754}.  The Generalized Anxiety Disorder-7 (GAD-7) is a brief self-report  measure that assesses symptoms of anxiety over the course of the last two weeks. [name] obtained a score of *** suggesting {gbgad7severity:21753}.  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 [name] has demonstrated progress in her goal as evidenced by ***  Plan: [name] continues to appear able and willing to participate as evidenced by engagement in reciprocal conversation, and asking questions for clarification as appropriate.*** The next appointment will be scheduled in {gbweeks:21758}. The next session will focus on reviewing learned skills, and working towards the established treatment goal.***

## 2019-02-16 ENCOUNTER — Encounter (INDEPENDENT_AMBULATORY_CARE_PROVIDER_SITE_OTHER): Payer: Self-pay | Admitting: Family Medicine

## 2019-02-16 ENCOUNTER — Ambulatory Visit (INDEPENDENT_AMBULATORY_CARE_PROVIDER_SITE_OTHER): Payer: 59 | Admitting: Family Medicine

## 2019-02-16 VITALS — BP 124/73 | HR 93 | Ht 66.0 in | Wt 237.0 lb

## 2019-02-16 DIAGNOSIS — E559 Vitamin D deficiency, unspecified: Secondary | ICD-10-CM

## 2019-02-16 DIAGNOSIS — F3289 Other specified depressive episodes: Secondary | ICD-10-CM

## 2019-02-16 DIAGNOSIS — Z6838 Body mass index (BMI) 38.0-38.9, adult: Secondary | ICD-10-CM

## 2019-02-16 DIAGNOSIS — Z9189 Other specified personal risk factors, not elsewhere classified: Secondary | ICD-10-CM | POA: Diagnosis not present

## 2019-02-16 NOTE — Progress Notes (Signed)
Office: [phone]  /  Fax: [phone]   HPI:   Chief Complaint: OBESITY [name] is here to discuss her progress with her obesity treatment plan. She is on the keep a food journal with 1600 calories and 90+ grams of protein daily and is following her eating plan approximately 35 % of the time. She states she has had an increase in walking and she is being more mindful of her steps 2 to 3 times per week. [name] notes increased stressor in school and with family. She has not been able to journal consistently and she is mostly trying to control her portions and make smarter food choices. Her weight is 237 lb (107.5 kg) today and has had a weight gain of 1 pound over a period of 3 weeks since her last visit. She has lost 2 lbs since starting treatment with [place].  Vitamin D deficiency [name] has a diagnosis of vitamin D deficiency. [name] is stable on vit D and she denies nausea, vomiting or muscle weakness.  At risk for cardiovascular disease [name] is at a higher than average risk for cardiovascular disease due to obesity. She currently denies any chest pain.  Depression with emotional eating behaviors [name] struggles with emotional eating and using food for comfort to the extent that it is negatively impacting her health. She often snacks when she is not hungry. [name] sometimes feels she is out of control and then feels guilty that she made poor food choices. Her mood is stable, but she has increased stress, especially with school. She notes feeling scattered and irritated. She has been working on behavior modification techniques to help reduce her emotional eating and has been somewhat successful. She shows no sign of suicidal or homicidal ideations.  Depression screen [name] 2/9 01/26/2019 10/20/2018 09/23/2018 09/11/2018  Decreased Interest 0 0 0 3  Down, Depressed, Hopeless 1 0 0 0  PHQ - 2 Score 1 0 0 3  Altered sleeping 0 - 0 2  Tired, decreased energy 1 - 1 3  Change in appetite 1 - 1 3    Feeling bad or failure about yourself  0 - 0 2  Trouble concentrating 1 - 1 3  Moving slowly or fidgety/restless 0 - 0 0  Suicidal thoughts 0 - 0 0  PHQ-9 Score 4 - 3 16  Difficult doing work/chores - - - Not difficult at all    ASSESSMENT AND PLAN:  Vitamin D deficiency - Plan: Vitamin D, Ergocalciferol, (DRISDOL) 1.25 MG (50000 UT) CAPS capsule  Other depression - with emotional eating - Plan: buPROPion (WELLBUTRIN SR) 150 MG 12 hr tablet  At risk for heart disease  Class 2 severe obesity with serious comorbidity and body mass index (BMI) of 38.0 to 38.9 in adult, unspecified obesity type (HCC)  PLAN:  Vitamin D Deficiency Eh was informed that low vitamin D levels contributes to fatigue and are associated with obesity, breast, and colon cancer. She agrees to continue to take prescription Vit D @50 ,000 IU every week #4 with no refills and will follow up for routine testing of vitamin D, at least 2-3 times per year. She was informed of the risk of over-replacement of vitamin D and agrees to not increase her dose unless she discusses this with [place] first. [name] agrees to follow up as directed.  Cardiovascular risk counseling [name] was given extended (15 minutes) coronary artery disease prevention counseling today. She is 43 y.o. female and has risk factors for heart disease including obesity. We  discussed intensive lifestyle modifications today with an emphasis on specific weight loss instructions and strategies. Pt was also informed of the importance of increasing exercise and decreasing saturated fats to help prevent heart disease.  Depression with Emotional Eating Behaviors We discussed behavior modification techniques today to help [name] deal with her emotional eating and depression. She has agreed to increase Wellbutrin SR 150 mg to BID #60 with no refills and follow up with our clinic in 3 weeks.  Obesity [name] is currently in the action stage of change. As such, her goal is to  continue with weight loss efforts She has agreed to keep a food journal with 1600 calories and 90 grams of protein daily [name] has been instructed to work up to a goal of 150 minutes of combined cardio and strengthening exercise per week for weight loss and overall health benefits. We discussed the following Behavioral Modification Strategies today: increasing lean protein intake, decreasing simple carbohydrates, work on meal planning and easy cooking plans, better snacking choices, ways to avoid boredom eating and ways to avoid night time snacking  [name] has agreed to follow up with our clinic in 3 weeks. She was informed of the importance of frequent follow up visits to maximize her success with intensive lifestyle modifications for her multiple health conditions.  ALLERGIES: No Known Allergies  MEDICATIONS: Current Outpatient Medications on File Prior to Visit  Medication Sig Dispense Refill  . ibuprofen (ADVIL,MOTRIN) 600 MG tablet 1  po  pc every 6 hours for 5 days then as needed for pain 30 tablet 0  . polyethylene glycol (MIRALAX / GLYCOLAX) packet Take 17 g by mouth daily.    . Vitamin D, Ergocalciferol, (DRISDOL) 1.25 MG (50000 UT) CAPS capsule Take 1 capsule (50,000 Units total) by mouth every 7 (seven) days. 4 capsule 0   No current facility-administered medications on file prior to visit.     PAST MEDICAL HISTORY: Past Medical History:  Diagnosis Date  . Anxiety   . Constipation   . Constipation   . Joint pain   . Lactose intolerance   . Nervousness   . Obesity   . PCOS (polycystic ovarian syndrome)   . Sickle cell trait ([place])   . Vaginal delivery 1997, 2005, 2011  . Vitamin D deficiency     PAST SURGICAL HISTORY: Past Surgical History:  Procedure Laterality Date  . CERVICAL CERCLAGE    . CYSTOSCOPY  09/19/2016   Procedure: CYSTOSCOPY;  Surgeon: Everett Graff, MD;  Location: [facility];  Service: Gynecology;;  . LAPAROSCOPIC ASSISTED VAGINAL HYSTERECTOMY N/A  09/19/2016   Procedure: LAPAROSCOPIC ASSISTED VAGINAL HYSTERECTOMY Bilateral Sapingectomy;  Surgeon: Everett Graff, MD;  Location: [facility];  Service: Gynecology;  Laterality: N/A;  . TONSILLECTOMY AND ADENOIDECTOMY  2009    SOCIAL HISTORY: Social History   Tobacco Use  . Smoking status: Never Smoker  . Smokeless tobacco: Never Used  Substance Use Topics  . Alcohol use: Yes    Comment: wine  . Drug use: No    FAMILY HISTORY: Family History  Problem Relation Age of Onset  . Hypertension Mother   . Alcoholism Mother   . Drug abuse Mother   . Hyperlipidemia Father   . Alcoholism Father   . Drug abuse Father   . Breast cancer Neg Hx     ROS: Review of Systems  Constitutional: Negative for weight loss.  Cardiovascular: Negative for chest pain.  Gastrointestinal: Negative for nausea and vomiting.  Musculoskeletal:  Negative for muscle weakness  Psychiatric/Behavioral: Positive for depression. Negative for suicidal ideas.       Positive for irritability Positive for stress    PHYSICAL EXAM: Blood pressure 124/73, pulse 93, height 5\' 6"  (1.676 m), weight 237 lb (107.5 kg), SpO2 98 %. Body mass index is 38.25 kg/m. Physical Exam Vitals signs reviewed.  Constitutional:      Appearance: Normal appearance. She is well-developed. She is obese.  Cardiovascular:     Rate and Rhythm: Normal rate.  Pulmonary:     Effort: Pulmonary effort is normal.  Musculoskeletal: Normal range of motion.  Skin:    General: Skin is warm and dry.  Neurological:     Mental Status: She is alert and oriented to person, place, and time.  Psychiatric:        Mood and Affect: Mood normal.        Behavior: Behavior normal.        Thought Content: Thought content does not include homicidal or suicidal ideation.     RECENT LABS AND TESTS: BMET    Component Value Date/Time   NA 138 12/08/2018 1002   K 4.4 12/08/2018 1002   CL 103 12/08/2018 1002   CO2 22 12/08/2018 1002   GLUCOSE 101  (H) 12/08/2018 1002   GLUCOSE 80 09/13/2016 0908   GLUCOSE 84 07/27/2010 0505   BUN 13 12/08/2018 1002   CREATININE 0.93 12/08/2018 1002   CALCIUM 9.3 12/08/2018 1002   GFRNONAA 77 12/08/2018 1002   GFRAA 89 12/08/2018 1002   Lab Results  Component Value Date   HGBA1C 5.0 12/08/2018   HGBA1C 5.2 09/11/2018   HGBA1C 5.1 06/12/2018   Lab Results  Component Value Date   INSULIN 9.0 12/08/2018   INSULIN 9.7 09/11/2018   CBC    Component Value Date/Time   WBC 4.3 09/11/2018 1201   WBC 10.4 09/20/2016 0610   RBC 4.38 09/11/2018 1201   RBC 3.79 (L) 09/20/2016 0610   HGB 13.0 09/11/2018 1201   HCT 39.5 09/11/2018 1201   PLT 286 09/20/2016 0610   MCV 90 09/11/2018 1201   MCH 29.7 09/11/2018 1201   MCH 29.0 09/20/2016 0610   MCHC 32.9 09/11/2018 1201   MCHC 35.0 09/20/2016 0610   RDW 13.7 09/11/2018 1201   LYMPHSABS 1.4 09/11/2018 1201   MONOABS 0.3 07/22/2010 0728   EOSABS 0.1 09/11/2018 1201   BASOSABS 0.0 09/11/2018 1201   Iron/TIBC/Ferritin/ %Sat No results found for: IRON, TIBC, FERRITIN, IRONPCTSAT Lipid Panel     Component Value Date/Time   CHOL 194 12/08/2018 1002   TRIG 78 12/08/2018 1002   HDL 59 12/08/2018 1002   LDLCALC 119 (H) 12/08/2018 1002   Hepatic Function Panel     Component Value Date/Time   PROT 7.0 12/08/2018 1002   ALBUMIN 4.4 12/08/2018 1002   AST 14 12/08/2018 1002   ALT 11 12/08/2018 1002   ALKPHOS 54 12/08/2018 1002   BILITOT 0.4 12/08/2018 1002      Component Value Date/Time   TSH 0.733 09/11/2018 1201   TSH 1.091 05/19/2012 1140     Ref. Range 12/08/2018 10:02  Vitamin D, 25-Hydroxy Latest Ref Range: 30.0 - 100.0 ng/mL 36.9     OBESITY BEHAVIORAL INTERVENTION VISIT  Today's visit was # 9   Starting weight: 239 lbs Starting date: 09/11/2018 Today's weight : 237 lbs  Today's date: 02/16/2019 Total lbs lost to date: 2    02/16/2019  Height 5\' 6"  (1.676 m)  Weight  237 lb (107.5 kg)  BMI (Calculated) 38.27  BLOOD PRESSURE  - SYSTOLIC 099  BLOOD PRESSURE - DIASTOLIC 73   Body Fat % 83.3 %  Total Body Water (lbs) 88.2 lbs    ASK: We discussed the diagnosis of obesity with [name] today and Rayla agreed to give [place] permission to discuss obesity behavioral modification therapy today.  ASSESS: Eveny has the diagnosis of obesity and her BMI today is 38.27 Yaslin is in the action stage of change   ADVISE: Lyanna was educated on the multiple health risks of obesity as well as the benefit of weight loss to improve her health. She was advised of the need for long term treatment and the importance of lifestyle modifications to improve her current health and to decrease her risk of future health problems.  AGREE: Multiple dietary modification options and treatment options were discussed and  Emmabelle agreed to follow the recommendations documented in the above note.  ARRANGE: Tova was educated on the importance of frequent visits to treat obesity as outlined per CMS and USPSTF guidelines and agreed to schedule her next follow up appointment today.  I, Doreene Nest, am acting as transcriptionist for Dennard Nip, MD  I have reviewed the above documentation for accuracy and completeness, and I agree with the above. -Dennard Nip, MD

## 2019-02-17 ENCOUNTER — Other Ambulatory Visit (INDEPENDENT_AMBULATORY_CARE_PROVIDER_SITE_OTHER): Payer: Self-pay

## 2019-02-17 ENCOUNTER — Encounter (INDEPENDENT_AMBULATORY_CARE_PROVIDER_SITE_OTHER): Payer: Self-pay | Admitting: Family Medicine

## 2019-02-17 DIAGNOSIS — F3289 Other specified depressive episodes: Secondary | ICD-10-CM

## 2019-02-17 MED ORDER — VITAMIN D (ERGOCALCIFEROL) 1.25 MG (50000 UNIT) PO CAPS: 50000.0000 [IU] | ORAL_CAPSULE | ORAL | 0 refills | Status: DC

## 2019-02-17 MED ORDER — BUPROPION HCL ER (SR) 150 MG PO TB12: 150.0000 mg | ORAL_TABLET | Freq: Two times a day (BID) | ORAL | 0 refills | Status: DC

## 2019-02-17 MED FILL — BUPROPION HCL SR 150 MG TAB: 150 | 30 days supply | Qty: 60 | Fill #0

## 2019-03-05 ENCOUNTER — Encounter (INDEPENDENT_AMBULATORY_CARE_PROVIDER_SITE_OTHER): Payer: Self-pay | Admitting: Family Medicine

## 2019-03-09 ENCOUNTER — Encounter (INDEPENDENT_AMBULATORY_CARE_PROVIDER_SITE_OTHER): Payer: Self-pay

## 2019-03-09 ENCOUNTER — Ambulatory Visit (INDEPENDENT_AMBULATORY_CARE_PROVIDER_SITE_OTHER): Payer: 59 | Admitting: Psychology

## 2019-03-09 ENCOUNTER — Ambulatory Visit (INDEPENDENT_AMBULATORY_CARE_PROVIDER_SITE_OTHER): Payer: Self-pay | Admitting: Family Medicine

## 2019-03-11 ENCOUNTER — Encounter (INDEPENDENT_AMBULATORY_CARE_PROVIDER_SITE_OTHER): Payer: Self-pay

## 2019-03-23 ENCOUNTER — Other Ambulatory Visit (INDEPENDENT_AMBULATORY_CARE_PROVIDER_SITE_OTHER): Payer: Self-pay | Admitting: Family Medicine

## 2019-03-23 ENCOUNTER — Encounter (INDEPENDENT_AMBULATORY_CARE_PROVIDER_SITE_OTHER): Payer: Self-pay

## 2019-03-23 DIAGNOSIS — F3289 Other specified depressive episodes: Secondary | ICD-10-CM

## 2019-03-26 ENCOUNTER — Telehealth (INDEPENDENT_AMBULATORY_CARE_PROVIDER_SITE_OTHER): Payer: Self-pay | Admitting: Psychology

## 2019-03-26 NOTE — Telephone Encounter (Signed)
  Office: [phone]  /  Fax: [phone]  Date of Call: March 26, 2019 Time of Call: 9:56am Duration of Call: 8 minutes Provider: Glennie Isle, PsyD  CONTENT: This provider called [name] to check-in and schedule an appointment, as [name] canceled her last appointment due to COVID-19 concerns. [name] expressed desire to rescheduled; therefore, this provider confirmed [name]'s e-mail address and provided instructions for WebEx appointments. This provider explained an e-mail will be sent with a secure link for the upcoming appointment, and the e-mail will also have an informed consent document for telepsychological services. This provider requested it be completed and returned. [name] verbally acknowledged understanding that sending the consent document via a MyChart message will make it part of her medical record; therefore, visible to all providers. A brief risk assessment was completed. [name] denied experiencing suicidal and homicidal ideation, plan, and intent since the last appointment with this provider.   PLAN: [name] is scheduled for a follow-up appointment via WebEx on April 02, 2019 at 11:00am.

## 2019-03-30 ENCOUNTER — Ambulatory Visit (INDEPENDENT_AMBULATORY_CARE_PROVIDER_SITE_OTHER): Payer: 59 | Admitting: Bariatrics

## 2019-03-30 ENCOUNTER — Other Ambulatory Visit: Payer: Self-pay

## 2019-03-30 ENCOUNTER — Encounter (INDEPENDENT_AMBULATORY_CARE_PROVIDER_SITE_OTHER): Payer: Self-pay | Admitting: Bariatrics

## 2019-03-30 DIAGNOSIS — Z6838 Body mass index (BMI) 38.0-38.9, adult: Secondary | ICD-10-CM

## 2019-03-30 DIAGNOSIS — F3289 Other specified depressive episodes: Secondary | ICD-10-CM | POA: Diagnosis not present

## 2019-03-30 DIAGNOSIS — E559 Vitamin D deficiency, unspecified: Secondary | ICD-10-CM | POA: Diagnosis not present

## 2019-03-30 MED ORDER — BUPROPION HCL ER (SR) 150 MG PO TB12: 150.0000 mg | ORAL_TABLET | Freq: Two times a day (BID) | ORAL | 0 refills | Status: DC

## 2019-03-30 MED ORDER — VITAMIN D (ERGOCALCIFEROL) 1.25 MG (50000 UNIT) PO CAPS: 50000.0000 [IU] | ORAL_CAPSULE | ORAL | 0 refills | Status: DC

## 2019-03-30 MED FILL — BUPROPION HCL SR 150 MG TAB: 150 | 30 days supply | Qty: 60 | Fill #0

## 2019-03-30 NOTE — Progress Notes (Signed)
Office: [phone]  /  Fax: [phone] TeleHealth Visit:  [name] has verbally consented to this TeleHealth visit today. The patient is located at home, the provider is located at the [employer] [facility] office. The participants in this visit include the listed provider and patient and any and all parties involved. The visit was conducted today via WebEx.  HPI:   Chief Complaint: OBESITY [name] is here to discuss her progress with her obesity treatment plan. She is on the keep a food journal with 1600 calories and 90 grams of protein daily and is following her eating plan approximately 15 % of the time. She states she is walking for 20 minutes 5 to 6 times per week and she is taking the stairs. [name] states that she has gained 2 pounds. She is working from home. We were unable to weigh the patient today for this TeleHealth visit. She feels as if she has gained weight since her last visit. She has lost 0 lbs since starting treatment with [place].  Vitamin D deficiency [name] has a diagnosis of vitamin D deficiency. She is currently taking vit D and denies nausea, vomiting or muscle weakness.  Depression with emotional eating behaviors [name] is struggling with emotional eating and using food for comfort to the extent that it is negatively impacting her health. She often snacks when she is not hungry. [name] sometimes feels she is out of control and then feels guilty that she made poor food choices. She has been working on behavior modification techniques to help reduce her emotional eating and has been somewhat successful. She shows no sign of suicidal or homicidal ideations.  Depression screen [facility] 2/9 01/26/2019 10/20/2018 09/23/2018 09/11/2018  Decreased Interest 0 0 0 3  Down, Depressed, Hopeless 1 0 0 0  PHQ - 2 Score 1 0 0 3  Altered sleeping 0 - 0 2  Tired, decreased energy 1 - 1 3  Change in appetite 1 - 1 3  Feeling bad or failure about yourself  0 - 0 2  Trouble  concentrating 1 - 1 3  Moving slowly or fidgety/restless 0 - 0 0  Suicidal thoughts 0 - 0 0  PHQ-9 Score 4 - 3 16  Difficult doing work/chores - - - Not difficult at all    ASSESSMENT AND PLAN:  Vitamin D deficiency - Plan: Vitamin D, Ergocalciferol, (DRISDOL) 1.25 MG (50000 UT) CAPS capsule  Other depression - with emotional eating - Plan: buPROPion (WELLBUTRIN SR) 150 MG 12 hr tablet  Class 2 severe obesity with serious comorbidity and body mass index (BMI) of 38.0 to 38.9 in adult, unspecified obesity type (HCC)  PLAN:  Vitamin D Deficiency [name] was informed that low vitamin D levels contributes to fatigue and are associated with obesity, breast, and colon cancer. She agrees to continue to take prescription Vit D @50 ,000 IU every week #4 with no refills and will follow up for routine testing of vitamin D, at least 2-3 times per year. She was informed of the risk of over-replacement of vitamin D and agrees to not increase her dose unless she discusses this with [place] first. [name] agrees to follow up as directed.  Depression with Emotional Eating Behaviors We discussed behavior modification techniques today to help [name] deal with her emotional eating and depression. She agreed to continue to take Wellbutrin SR 150 mg BID #60 with no refills and follow up as directed. [name] will meet with Dr. Mallie Mussel.  Obesity [name] is currently  in the action stage of change. As such, her goal is to continue with weight loss efforts She has agreed to keep a food journal with 1600 calories and 90+ grams of protein daily [name] will continue exercise for weight loss and overall health benefits. We discussed the following Behavioral Modification Strategies today: increase H2O intake, no skipping meals, keeping healthy foods in the home, increasing lean protein intake, decreasing simple carbohydrates, increasing vegetables, decrease eating out and work on meal planning and easy cooking plans [name] will weigh  herself at home before each visit.  [name] has agreed to follow up with our clinic in 2 weeks. She was informed of the importance of frequent follow up visits to maximize her success with intensive lifestyle modifications for her multiple health conditions.  ALLERGIES: No Known Allergies  MEDICATIONS: Current Outpatient Medications on File Prior to Visit  Medication Sig Dispense Refill  . ibuprofen (ADVIL,MOTRIN) 600 MG tablet 1  po  pc every 6 hours for 5 days then as needed for pain 30 tablet 0  . polyethylene glycol (MIRALAX / GLYCOLAX) packet Take 17 g by mouth daily.     No current facility-administered medications on file prior to visit.     PAST MEDICAL HISTORY: Past Medical History:  Diagnosis Date  . Anxiety   . Constipation   . Constipation   . Joint pain   . Lactose intolerance   . Nervousness   . Obesity   . PCOS (polycystic ovarian syndrome)   . Sickle cell trait ([place])   . Vaginal delivery 1997, 2005, 2011  . Vitamin D deficiency     PAST SURGICAL HISTORY: Past Surgical History:  Procedure Laterality Date  . CERVICAL CERCLAGE    . CYSTOSCOPY  09/19/2016   Procedure: CYSTOSCOPY;  Surgeon: Everett Graff, MD;  Location: [facility];  Service: Gynecology;;  . LAPAROSCOPIC ASSISTED VAGINAL HYSTERECTOMY N/A 09/19/2016   Procedure: LAPAROSCOPIC ASSISTED VAGINAL HYSTERECTOMY Bilateral Sapingectomy;  Surgeon: Everett Graff, MD;  Location: [facility];  Service: Gynecology;  Laterality: N/A;  . TONSILLECTOMY AND ADENOIDECTOMY  2009    SOCIAL HISTORY: Social History   Tobacco Use  . Smoking status: Never Smoker  . Smokeless tobacco: Never Used  Substance Use Topics  . Alcohol use: Yes    Comment: wine  . Drug use: No    FAMILY HISTORY: Family History  Problem Relation Age of Onset  . Hypertension Mother   . Alcoholism Mother   . Drug abuse Mother   . Hyperlipidemia Father   . Alcoholism Father   . Drug abuse Father   . Breast cancer Neg Hx     ROS: Review of  Systems  Constitutional: Negative for weight loss.  Gastrointestinal: Negative for nausea and vomiting.  Musculoskeletal:       Negative for muscle weakness  Psychiatric/Behavioral: Positive for depression. Negative for suicidal ideas.    PHYSICAL EXAM: Pt in no acute distress  RECENT LABS AND TESTS: BMET    Component Value Date/Time   NA 138 12/08/2018 1002   K 4.4 12/08/2018 1002   CL 103 12/08/2018 1002   CO2 22 12/08/2018 1002   GLUCOSE 101 (H) 12/08/2018 1002   GLUCOSE 80 09/13/2016 0908   GLUCOSE 84 07/27/2010 0505   BUN 13 12/08/2018 1002   CREATININE 0.93 12/08/2018 1002   CALCIUM 9.3 12/08/2018 1002   GFRNONAA 77 12/08/2018 1002   GFRAA 89 12/08/2018 1002   Lab Results  Component Value Date   HGBA1C 5.0 12/08/2018  HGBA1C 5.2 09/11/2018   HGBA1C 5.1 06/12/2018   Lab Results  Component Value Date   INSULIN 9.0 12/08/2018   INSULIN 9.7 09/11/2018   CBC    Component Value Date/Time   WBC 4.3 09/11/2018 1201   WBC 10.4 09/20/2016 0610   RBC 4.38 09/11/2018 1201   RBC 3.79 ([place]) 09/20/2016 0610   HGB 13.0 09/11/2018 1201   HCT 39.5 09/11/2018 1201   PLT 286 09/20/2016 0610   MCV 90 09/11/2018 1201   MCH 29.7 09/11/2018 1201   MCH 29.0 09/20/2016 0610   MCHC 32.9 09/11/2018 1201   MCHC 35.0 09/20/2016 0610   RDW 13.7 09/11/2018 1201   LYMPHSABS 1.4 09/11/2018 1201   MONOABS 0.3 07/22/2010 0728   EOSABS 0.1 09/11/2018 1201   BASOSABS 0.0 09/11/2018 1201   Iron/TIBC/Ferritin/ %Sat No results found for: IRON, TIBC, FERRITIN, IRONPCTSAT Lipid Panel     Component Value Date/Time   CHOL 194 12/08/2018 1002   TRIG 78 12/08/2018 1002   HDL 59 12/08/2018 1002   LDLCALC 119 (H) 12/08/2018 1002   Hepatic Function Panel     Component Value Date/Time   PROT 7.0 12/08/2018 1002   ALBUMIN 4.4 12/08/2018 1002   AST 14 12/08/2018 1002   ALT 11 12/08/2018 1002   ALKPHOS 54 12/08/2018 1002   BILITOT 0.4 12/08/2018 1002      Component Value Date/Time    TSH 0.733 09/11/2018 1201   TSH 1.091 05/19/2012 1140   Results for Champagne, [name] (MRN [id number]) as of 03/30/2019 16:14  Ref. Range 12/08/2018 10:02  Vitamin D, 25-Hydroxy Latest Ref Range: 30.0 - 100.0 ng/mL 36.9    I, Doreene Nest, am acting as Location manager for [employer]. Owens Shark, DO  I have reviewed the above documentation for accuracy and completeness, and I agree with the above. -Jearld Lesch, DO

## 2019-03-31 NOTE — Progress Notes (Signed)
Office: [phone]  /  Fax: [phone]    Date: April 02, 2019   Appointment Start Time: 11:08am Duration: 27 minutes Provider: Glennie Isle, Psy.D. Type of Session: Individual Therapy  Location of Patient: Home- bedroom Location of Provider: Home Type of Contact: Telepsychological Visit via [employer] Webex   Session Content:Prior to initiating telepsychological services, [name] was Bryant with an informed consent document via e-mail, which included the development of a safety plan (I.e., an emergency contact and emergency resources) in the event of an emergency/crisis. [name] was unable to complete the form and return it to this provider prior to today's appointment. As such, this provider verbally discussed the consent form during today's appointment. [name] an emergency contact, and verbally acknowledged understanding that she is ultimately responsible for understanding her insurance benefits as it relates to reimbursement of telepsychological services. This provider also reviewed confidentiality, as it relates to telepsychological services, as well as the rationale for telepsychological services. More specifically, this provider's clinic is closed for in-person visits due to COVID-19. Therapeutic services will resume to in-person appointments once the clinic re-opens. [name] expressed understanding regarding the rationale for telepsychological services. In addition, this provider explained the telepsychological services informed consent document would be considered an addendum to the initial consent document. [name] verbally consented to proceed, and noted she would return the completed consent form via MyChart. Regarding MyChart messages, [name] verbally acknowledged understanding that any messages sent would be a part of her electronic medical record; therefore, visible to all providers.    [name] is a 43 y.o. female presenting via [place] Webex for a follow-up appointment to address the  previously established treatment goal of decreasing emotional eating. Prior to proceeding with today's appointment, [name]'s physical location at the time of this appointment was obtained. [name] reported she was at home and Bryant the address. In the event of technical difficulties, [name] shared a phone number she could be reached at. [name] and this provider participated in today's telepsychological service. Also, [name] denied anyone else being present in the room or on the [employer].   It is important to note this provider called [name] at 11:05am, as she had not presented for her WebEx appointment. She indicated she was unsure how to connect; therefore, this provider assisted [name]. As such, the appointment was initiated at 11:08am. A technician was at her home to install an A/C unit; therefore, this provider requested she sit somewhere else for privacy/confidentiality reasons. [name] was agreeable, and moved to her bedroom. Around 11:27am, she requested to step away from the appointment to speak with the technician. The appointment was resumed after she returned.   After discussing the consent form for telepsychological services, this provider verbally administered the PHQ-9 and GAD-7, and conducted a brief check-in. 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 Initially, [name] acknowledged she was "very anxious" and experienced difficulty with calming down. Since then, she discussed making changes (e.g., communicating with family) and now feels things are better. Regarding eating, [name] stated, "I threw ideas of dieting completely out the window for the past few weeks." Nevertheless, at the time of the appointment, [name] expressed desire to resume eating congruent to the meal plan and work towards reducing emotional eating. Thus, this provider reviewed  triggers for emotional eating with [name], and the handout for triggers was sent to [name] via  a MyChart message, as she was unsure where she placed the copy previously Bryant. Prior to sending the [place] message, this provider explained the message would be visible to all providers, as it would be part of the electronic medical record. [name] verbally acknowledged understanding, and verbally consented to this provider sending the MyChart message. [name] was receptive to today's session as evidenced by openness to sharing, responsiveness to feedback, and willingness to explore her triggers for emotional eating during the pandemic.  Mental Status Examination:  Appearance: neat Behavior: cooperative Mood: euthymic Affect: mood congruent Speech: normal in rate, volume, and tone Eye Contact: appropriate Psychomotor Activity: appropriate Thought Process: linear, logical, and goal directed  Content/Perceptual Disturbances: denies suicidal and homicidal ideation, plan, and intent and no hallucinations, delusions, bizarre thinking or behavior reported or observed Orientation: time, person, place and purpose of appointment Cognition/Sensorium: memory, attention, language, and fund of knowledge intact  Insight: good Judgment: good  Structured Assessment Results: The Patient Health Questionnaire-9 (PHQ-9) is a self-report measure that assesses symptoms and severity of depression over the course of the last two weeks. [name] obtained a score of 9 suggesting mild depression. [name] finds the endorsed symptoms to be very difficult. Depression screen [name] 2/9 04/02/2019  Decreased Interest 0  Down, Depressed, Hopeless 1  PHQ - 2 Score 1  Altered sleeping 0  Tired, decreased energy 0  Change in appetite 2  Feeling bad or failure about yourself  0  Trouble concentrating 3  Moving slowly or fidgety/restless 3  Suicidal thoughts 0  PHQ-9 Score 9  Difficult doing work/chores -   The Generalized Anxiety  Disorder-7 (GAD-7) is a brief self-report measure that assesses symptoms of anxiety over the course of the last two weeks. [name] obtained a score of 15 suggesting severe anxiety. GAD 7 : Generalized Anxiety Score 04/02/2019  Nervous, Anxious, on Edge 3  Control/stop worrying 2  Worry too much - different things 2  Trouble relaxing 2  Restless 2  Easily annoyed or irritable 2  Afraid - awful might happen 2  Total GAD 7 Score 15  Anxiety Difficulty Somewhat difficult   Interventions:  Administration of PHQ-9 and GAD-7 for symptom monitoring Review of content from the previous session Empathic reflections and validation Psychoeducation regarding triggers for emotional eating Positive reinforcement Brief chart review Employed supportive psychotherapy interventions today to facilitate reduced distress, and to improve coping skills with identified stressors  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 Shabree has demonstrated some progress in her goal as evidenced by increased awareness of hunger patterns and triggers for emotional eating; however, she acknowledged an increase in emotional eating secondary to COVID-19.   Plan: [name] continues to appear able and willing to participate as evidenced by engagement in reciprocal conversation, and asking questions for clarification as appropriate. The next appointment will be scheduled in two weeks, which will be via [employer]. Once this provider's office resumes in-person appointments, Messiah will be notified. The next session will focus on pleasurable activities.

## 2019-04-02 ENCOUNTER — Other Ambulatory Visit: Payer: Self-pay

## 2019-04-02 ENCOUNTER — Encounter (INDEPENDENT_AMBULATORY_CARE_PROVIDER_SITE_OTHER): Payer: Self-pay

## 2019-04-02 ENCOUNTER — Ambulatory Visit (INDEPENDENT_AMBULATORY_CARE_PROVIDER_SITE_OTHER): Payer: 59 | Admitting: Psychology

## 2019-04-02 DIAGNOSIS — F418 Other specified anxiety disorders: Secondary | ICD-10-CM | POA: Diagnosis not present

## 2019-04-14 ENCOUNTER — Encounter (INDEPENDENT_AMBULATORY_CARE_PROVIDER_SITE_OTHER): Payer: Self-pay

## 2019-04-14 ENCOUNTER — Ambulatory Visit (INDEPENDENT_AMBULATORY_CARE_PROVIDER_SITE_OTHER): Payer: 59 | Admitting: Psychology

## 2019-04-14 ENCOUNTER — Other Ambulatory Visit: Payer: Self-pay

## 2019-04-14 DIAGNOSIS — F418 Other specified anxiety disorders: Secondary | ICD-10-CM

## 2019-04-14 NOTE — Progress Notes (Signed)
Office: [phone]  /  Fax: [phone]    Date: April 14, 2019   Appointment Start Time: 9:30am Duration: 30 minutes Provider: Glennie Isle, Psy.D. Type of Session: Individual Therapy  Location of Patient: Home-bedroom Location of Provider: Office  Type of Contact: Telepsychological Visit via [employer] WebEx   Session Content: Harbour is a 43 y.o. female presenting via [place] for a follow-up appointment to address the previously established treatment goal of decreasing emotional eating. Today's appointment was a telepsychological visit, as this provider's clinic is closed for in-person visits due to COVID-19. Therapeutic services will resume to in-person appointments once the clinic re-opens. Euphemia expressed understanding regarding the rationale for telepsychological services, and provided verbal consent for today's appointment. Prior to proceeding with today's appointment, Nitya's physical location at the time of this appointment was obtained. Sharlisa reported she was at home in her bedroom and provided the address. In the event of technical difficulties, Donia shared a phone number she could be reached at. [name] and this provider participated in today's telepsychological service. Also, Doryce denied anyone else being present in the room or on the WebEx appointment.   This provider conducted a brief check-in. Soraida reported she reviewed the triggers handout and identified the following triggers for emotional eating: out of habit; stress; and squelch urgency. She also discussed an increase in water intake, but expressed continued difficulty with the prescribed protein intake. This was explored. Laelle identified dinner as the most difficult, and explained "it is out of habit there and it is always a meat, carb, and vegetable." She acknowledged typically consuming more of the carb versus meat. Jovi further added, "I'm frustrated with myself because weight loss is something I should be able to do  alone." As such, associated thoughts and feelings were processed. She noted, "I shouldn't have to do this." Teretha also discussed experiencing "resistance." Thus, psychoeducation regarding obesity as a disease was provided, and this provider also discussed the cyclical nature of emotional eating to help Harlan develop further insight regarding her eating patterns. She was receptive and noted, "You hit the nail on the head." Session then focused on developing a goal for dinner with the assistance of the hunger and satisfaction scale. Psychoeducation regarding the hunger and satisfaction scale was provided, and a handout was sent to Corlene to via a MyChart message. More specifically, this provider assisted Yarielis with setting the goal of focusing on consuming her protein and vegetable first based on her hunger level, and then focusing on consuming the carb as she finishes her meal. Trany demonstrated understanding of how to use the hunger and satisfaction scale, as evidenced by her repeating in her own words the purpose and plan for the hunger and satisfaction scale. Overall, Aylee was receptive to today's session as evidenced by openness to sharing, responsiveness to feedback, and willingness to utilize the hunger and satisfaction scale.  Mental Status Examination:  Appearance: neat Behavior: cooperative Mood: euthymic Affect: mood congruent Speech: normal in rate, volume, and tone Eye Contact: appropriate Psychomotor Activity: appropriate Thought Process: linear, logical, and goal directed  Content/Perceptual Disturbances: denies suicidal and homicidal ideation, plan, and intent and no hallucinations, delusions, bizarre thinking or behavior reported or observed Orientation: time, person, place and purpose of appointment Cognition/Sensorium: memory, attention, language, and fund of knowledge intact  Insight: good Judgment: good   Interventions:  Reviewed content from the previous session Provided  empathic reflections and validation Engaged Saraia in problem solving Engaged Mirelle in goal setting Assisted in processing thoughts  and feelings Provided psychoeducation regarding the hunger and satisfaction scale Conducted a brief chart review Employed supportive psychotherapy interventions today to facilitate reduced distress, and to improve coping skills with identified stressors Employed insight oriented and cognitive psychotherapy interventions to identify and modify anxiety / mood producing thoughts, beliefs, behaviors and negative self-appraisals contributing to distress  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 Christean has demonstrated progress in her goal as evidenced by increased awareness of hunger patterns and triggers for emotional eating.   Plan: Marionna continues to appear able and willing to participate as evidenced by engagement in reciprocal conversation, and asking questions for clarification as appropriate. The next appointment will be scheduled in two weeks, which will be via [employer]. Once this provider's office resumes in-person appointments, Arasely will be notified. The next session will focus on discussing pleasurable activities, as they were not introduced today due to Larue presenting concerns.

## 2019-04-16 ENCOUNTER — Ambulatory Visit (INDEPENDENT_AMBULATORY_CARE_PROVIDER_SITE_OTHER): Payer: 59 | Admitting: Family Medicine

## 2019-04-16 ENCOUNTER — Other Ambulatory Visit: Payer: Self-pay

## 2019-04-16 ENCOUNTER — Encounter (INDEPENDENT_AMBULATORY_CARE_PROVIDER_SITE_OTHER): Payer: Self-pay | Admitting: Family Medicine

## 2019-04-16 DIAGNOSIS — E559 Vitamin D deficiency, unspecified: Secondary | ICD-10-CM | POA: Diagnosis not present

## 2019-04-16 DIAGNOSIS — Z6838 Body mass index (BMI) 38.0-38.9, adult: Secondary | ICD-10-CM

## 2019-04-16 DIAGNOSIS — F3289 Other specified depressive episodes: Secondary | ICD-10-CM

## 2019-04-16 MED ORDER — VITAMIN D (ERGOCALCIFEROL) 1.25 MG (50000 UNIT) PO CAPS: 50000.0000 [IU] | ORAL_CAPSULE | ORAL | 0 refills | Status: DC

## 2019-04-16 MED FILL — VIT D2 1.25 MG (50,000 UNIT: 1.25 MG | 28 days supply | Qty: 4 | Fill #0

## 2019-04-20 NOTE — Progress Notes (Signed)
Office: [phone]  /  Fax: [phone] TeleHealth Visit:  [name] has verbally consented to this TeleHealth visit today. The patient is located at home, the provider is located at the [employer] [facility] office. The participants in this visit include the listed provider and patient. [name] was unable to use Webex today and the telehealth visit was conducted via telephone.  HPI:   Chief Complaint: OBESITY [name] is here to discuss her progress with her obesity treatment plan. She is keeping a food journal with 1500 calories and 90 grams of protein and is following her eating plan approximately 65 % of the time. She states she is more active per week. [name] feels that she has been doing more stress eating in the last 2 weeks, which has been worse since COVID19 isolation. She has been journaling on and off, but feeling guilty about not following her plan perfectly.  We were unable to weigh the patient today for this TeleHealth visit. She feels as if she has gained weight since her last visit. She has lost 2 lbs since starting treatment with [place].  Vitamin D Deficiency [name] has a diagnosis of vitamin D deficiency. She is currently stable on vit D, but is not yet at goal. [name] denies nausea, vomiting, or muscle weakness.  Depression with emotional eating behaviors [name] had an appointment with Dr. Mallie Mussel and feels that she had a lot of insight on her emotional eating, using food for comfort to the extent that it is negatively impacting her health, and her own expectations. She is stable on Wellbutrin. She is struggling with emotional eating and  She often snacks when she is not hungry. [name] sometimes feels she is out of control and then feels guilty that she made poor food choices. She has been working on behavior modification techniques to help reduce her emotional eating and has been somewhat successful. She denies palpitations and insomnia.  Depression screen [facility] 2/9 04/02/2019  01/26/2019 10/20/2018 09/23/2018 09/11/2018  Decreased Interest 0 0 0 0 3  Down, Depressed, Hopeless 1 1 0 0 0  PHQ - 2 Score 1 1 0 0 3  Altered sleeping 0 0 - 0 2  Tired, decreased energy 0 1 - 1 3  Change in appetite 2 1 - 1 3  Feeling bad or failure about yourself  0 0 - 0 2  Trouble concentrating 3 1 - 1 3  Moving slowly or fidgety/restless 3 0 - 0 0  Suicidal thoughts 0 0 - 0 0  PHQ-9 Score 9 4 - 3 16  Difficult doing work/chores - - - - Not difficult at all   ASSESSMENT AND PLAN:  Vitamin D deficiency - Plan: Vitamin D, Ergocalciferol, (DRISDOL) 1.25 MG (50000 UT) CAPS capsule  Other depression - with emotional eating  Class 2 severe obesity with serious comorbidity and body mass index (BMI) of 38.0 to 38.9 in adult, unspecified obesity type (HCC)  PLAN:  Vitamin D Deficiency [name] was informed that low vitamin D levels contribute to fatigue and are associated with obesity, breast, and colon cancer. [name] agrees to continue to take prescription Vit D @50 ,000 IU every week #4 with no refills and will follow up for routine testing of vitamin D, at least 2-3 times per year. She was informed of the risk of over-replacement of vitamin D and agrees to not increase her dose unless she discusses this with [place] first. [name] agrees to follow up in 2 weeks as directed.  Depression  with Emotional Eating Behaviors [name] was encouraged to continue seeing Dr. Mallie Mussel and we discussed cognitive behavior modification techniques to help decrease her emotional eating and depression. She has agreed to follow up as directed to monitor her progress.  I spent > than 50% of the 25 minute visit on counseling as documented in the note.  Obesity [name] is currently in the action stage of change. As such, her goal is to continue with weight loss efforts. She has agreed to keep a food journal with 1500 calories and 90 grams of protein.  [name] has been instructed to work up to a goal of 150 minutes of combined  cardio and strengthening exercise per week for weight loss and overall health benefits. We discussed the following Behavioral Modification Strategies today: work on meal planning, keep a strict food journal, and easy cooking plans and emotional eating strategies.  [name] has agreed to follow up with our clinic in 2 weeks. She was informed of the importance of frequent follow up visits to maximize her success with intensive lifestyle modifications for her multiple health conditions.  ALLERGIES: No Known Allergies  MEDICATIONS: Current Outpatient Medications on File Prior to Visit  Medication Sig Dispense Refill  . buPROPion (WELLBUTRIN SR) 150 MG 12 hr tablet Take 1 tablet (150 mg total) by mouth 2 (two) times daily. 60 tablet 0  . ibuprofen (ADVIL,MOTRIN) 600 MG tablet 1  po  pc every 6 hours for 5 days then as needed for pain 30 tablet 0  . polyethylene glycol (MIRALAX / GLYCOLAX) packet Take 17 g by mouth daily.     No current facility-administered medications on file prior to visit.     PAST MEDICAL HISTORY: Past Medical History:  Diagnosis Date  . Anxiety   . Constipation   . Constipation   . Joint pain   . Lactose intolerance   . Nervousness   . Obesity   . PCOS (polycystic ovarian syndrome)   . Sickle cell trait ([place])   . Vaginal delivery 1997, 2005, 2011  . Vitamin D deficiency     PAST SURGICAL HISTORY: Past Surgical History:  Procedure Laterality Date  . CERVICAL CERCLAGE    . CYSTOSCOPY  09/19/2016   Procedure: CYSTOSCOPY;  Surgeon: Everett Graff, MD;  Location: [facility];  Service: Gynecology;;  . LAPAROSCOPIC ASSISTED VAGINAL HYSTERECTOMY N/A 09/19/2016   Procedure: LAPAROSCOPIC ASSISTED VAGINAL HYSTERECTOMY Bilateral Sapingectomy;  Surgeon: Everett Graff, MD;  Location: [facility];  Service: Gynecology;  Laterality: N/A;  . TONSILLECTOMY AND ADENOIDECTOMY  2009    SOCIAL HISTORY: Social History   Tobacco Use  . Smoking status: Never Smoker  . Smokeless tobacco:  Never Used  Substance Use Topics  . Alcohol use: Yes    Comment: wine  . Drug use: No    FAMILY HISTORY: Family History  Problem Relation Age of Onset  . Hypertension Mother   . Alcoholism Mother   . Drug abuse Mother   . Hyperlipidemia Father   . Alcoholism Father   . Drug abuse Father   . Breast cancer Neg Hx     ROS: Review of Systems  Cardiovascular: Negative for palpitations.  Gastrointestinal: Negative for nausea and vomiting.  Musculoskeletal:       Negative for muscle weakness.  Psychiatric/Behavioral: Positive for depression. The patient does not have insomnia.     PHYSICAL EXAM: Pt in no acute distress  RECENT LABS AND TESTS: BMET    Component Value Date/Time   NA 138 12/08/2018 1002  K 4.4 12/08/2018 1002   CL 103 12/08/2018 1002   CO2 22 12/08/2018 1002   GLUCOSE 101 (H) 12/08/2018 1002   GLUCOSE 80 09/13/2016 0908   GLUCOSE 84 07/27/2010 0505   BUN 13 12/08/2018 1002   CREATININE 0.93 12/08/2018 1002   CALCIUM 9.3 12/08/2018 1002   GFRNONAA 77 12/08/2018 1002   GFRAA 89 12/08/2018 1002   Lab Results  Component Value Date   HGBA1C 5.0 12/08/2018   HGBA1C 5.2 09/11/2018   HGBA1C 5.1 06/12/2018   Lab Results  Component Value Date   INSULIN 9.0 12/08/2018   INSULIN 9.7 09/11/2018   CBC    Component Value Date/Time   WBC 4.3 09/11/2018 1201   WBC 10.4 09/20/2016 0610   RBC 4.38 09/11/2018 1201   RBC 3.79 (L) 09/20/2016 0610   HGB 13.0 09/11/2018 1201   HCT 39.5 09/11/2018 1201   PLT 286 09/20/2016 0610   MCV 90 09/11/2018 1201   MCH 29.7 09/11/2018 1201   MCH 29.0 09/20/2016 0610   MCHC 32.9 09/11/2018 1201   MCHC 35.0 09/20/2016 0610   RDW 13.7 09/11/2018 1201   LYMPHSABS 1.4 09/11/2018 1201   MONOABS 0.3 07/22/2010 0728   EOSABS 0.1 09/11/2018 1201   BASOSABS 0.0 09/11/2018 1201   Iron/TIBC/Ferritin/ %Sat No results found for: IRON, TIBC, FERRITIN, IRONPCTSAT Lipid Panel     Component Value Date/Time   CHOL 194 12/08/2018  1002   TRIG 78 12/08/2018 1002   HDL 59 12/08/2018 1002   LDLCALC 119 (H) 12/08/2018 1002   Hepatic Function Panel     Component Value Date/Time   PROT 7.0 12/08/2018 1002   ALBUMIN 4.4 12/08/2018 1002   AST 14 12/08/2018 1002   ALT 11 12/08/2018 1002   ALKPHOS 54 12/08/2018 1002   BILITOT 0.4 12/08/2018 1002      Component Value Date/Time   TSH 0.733 09/11/2018 1201   TSH 1.091 05/19/2012 1140   Results for Hornig, Kenisha L (MRN [id number]) as of 04/20/2019 09:26  Ref. Range 12/08/2018 10:02  Vitamin D, 25-Hydroxy Latest Ref Range: 30.0 - 100.0 ng/mL 36.9    I, Marcille Blanco, CMA, am acting as transcriptionist for Starlyn Skeans, MD I have reviewed the above documentation for accuracy and completeness, and I agree with the above. -Dennard Nip, MD

## 2019-04-27 ENCOUNTER — Encounter (INDEPENDENT_AMBULATORY_CARE_PROVIDER_SITE_OTHER): Payer: Self-pay

## 2019-04-27 NOTE — Progress Notes (Addendum)
Office: [phone]  /  Fax: [phone]    Date: Apr 28, 2019   Appointment Start Time: 8:59am Duration: 31 minutes Provider: Glennie Isle, Psy.D. Type of Session: Individual Therapy  Location of Patient: Home Location of Provider: Provider's Home Type of Contact: Telepsychological Visit via [employer] Web[employer]   Session Content: [name] is a 43 y.o. female presenting via [place] for a follow-up appointment to address the previously established treatment goal of decreasing emotional eating. Today's appointment was a telepsychological visit, as this provider's clinic is closed for in-person visits due to COVID-19. Therapeutic services will resume to in-person appointments once the clinic re-opens. [name] expressed understanding regarding the rationale for telepsychological services, and provided verbal consent for today's appointment. Prior to proceeding with today's appointment, [name]'s physical location at the time of this appointment was obtained. [name] reported she was at home and provided the address. In the event of technical difficulties, [name] shared a phone number she could be reached at. [name] and this provider participated in today's telepsychological service. Also, [name] denied anyone else being present in the room or on the WebEx appointment.  This provider conducted a brief check-in and verbally administered the PHQ-9 and GAD-7. Syvilla stated, "School has ended." She discussed journaling and she indicated she noticed that when her "stress level is at the highest" she will snack to reduce feeling fidgety. [name] indicated she has tried to chew gum and listen to music, which has reportedly helped. This provider also recommended the utilization of a stress ball and listening to [employer] by [employer]. [name] was receptive. 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 Moreover, psychoeducation regarding pleasurable activities, including its impact on emotional eating and overall well-being was provided. [name] was provided with a handout via e-mail with various options of pleasurable activities, and was encouraged to engage in one activity a day and additional activities when triggered to emotionally eat. [name] agreed. [name] provided verbal consent during today's appointment for this provider to send the consent for telepsychological services again as well as the pleasurable activities handout via e-mail. Overall, [name] was receptive to today's session as evidenced by openness to sharing, responsiveness to feedback, and willingness to engage in pleasurable activities.  Mental Status Examination:  Appearance: neat Behavior: cooperative Mood: euthymic Affect: mood congruent Speech: normal in rate, volume, and tone Eye Contact: appropriate Psychomotor Activity: appropriate Thought Process: linear, logical, and goal directed  Content/Perceptual Disturbances: denies suicidal and homicidal ideation, plan, and intent and no hallucinations, delusions, bizarre thinking or behavior reported or observed Orientation: time, person, place and purpose of appointment Cognition/Sensorium: memory, attention, language, and fund of knowledge intact  Insight: good Judgment: good  Structured Assessment Results: The Patient Health Questionnaire-9 (PHQ-9) is a self-report measure that assesses symptoms and severity of depression over the course of the last two weeks. [name] obtained a score of 4 suggesting mild depression. [name] finds the endorsed symptoms to be somewhat difficult. Decreased interest 0  Down, depressed, hopeless 0  Altered sleeping 0  Tired, decreased energy 0  Change in appetite 1  Feeling bad or failure about yourself 1  Trouble concentrating 1  Moving slowly or fidgety/restless 1  Suicidal thoughts 0    PHQ-9 Score 4    The Generalized Anxiety Disorder-7 (GAD-7) is a brief self-report measure that assesses  symptoms of anxiety over the course of the last two weeks. [name] obtained a score of 5 suggesting mild anxiety. [name] finds the endorsed symptoms to be not difficult at all. Nervous, anxious, on edge 1  Control/stop worrying 1  Worrying too much- different things 1  Trouble relaxing 1  Restless 0  Easily annoyed or irritable 0  Afraid-awful might happen 1  GAD-7 Score 5   Interventions:  Verbal administration of PHQ-9 and GAD-7 for symptom monitoring Reviewed content from the previous session Provided empathic reflections and validation Psychoeducation provided regarding pleasurable activities Provided positive reinforcement Employed supportive psychotherapy interventions to facilitate reduced distress, and to improve coping skills with identified stressors Conducted a brief chart review  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 [name] has demonstrated progress in her goal as evidenced by increased awareness of hunger patterns and triggers for emotional eating. Per today's appointment, there is also an increase in coping.   Plan: [name] continues to appear able and willing to participate as evidenced by engagement in reciprocal conversation, and asking questions for clarification as appropriate. The next appointment will be scheduled in two weeks, which will be via [employer]. Once this provider's office resumes in-person appointments, [name] will be notified. The next session will focus on reviewing pleasurable activities and the introduction of mindfulness.

## 2019-04-28 ENCOUNTER — Ambulatory Visit (INDEPENDENT_AMBULATORY_CARE_PROVIDER_SITE_OTHER): Payer: 59 | Admitting: Psychology

## 2019-04-28 ENCOUNTER — Other Ambulatory Visit: Payer: Self-pay

## 2019-04-28 DIAGNOSIS — F418 Other specified anxiety disorders: Secondary | ICD-10-CM

## 2019-04-30 ENCOUNTER — Ambulatory Visit (INDEPENDENT_AMBULATORY_CARE_PROVIDER_SITE_OTHER): Payer: 59 | Admitting: Family Medicine

## 2019-05-04 DIAGNOSIS — L74513 Primary focal hyperhidrosis, soles: Secondary | ICD-10-CM | POA: Diagnosis not present

## 2019-05-04 DIAGNOSIS — L219 Seborrheic dermatitis, unspecified: Secondary | ICD-10-CM | POA: Diagnosis not present

## 2019-05-04 DIAGNOSIS — D229 Melanocytic nevi, unspecified: Secondary | ICD-10-CM | POA: Diagnosis not present

## 2019-05-04 DIAGNOSIS — L74512 Primary focal hyperhidrosis, palms: Secondary | ICD-10-CM | POA: Diagnosis not present

## 2019-05-04 DIAGNOSIS — L731 Pseudofolliculitis barbae: Secondary | ICD-10-CM | POA: Diagnosis not present

## 2019-05-04 DIAGNOSIS — L74510 Primary focal hyperhidrosis, axilla: Secondary | ICD-10-CM | POA: Diagnosis not present

## 2019-05-04 DIAGNOSIS — L680 Hirsutism: Secondary | ICD-10-CM | POA: Diagnosis not present

## 2019-05-04 MED FILL — CLINDAMYCIN PH 1% SOLUTION: 1 | 30 days supply | Qty: 60 | Fill #0

## 2019-05-04 MED FILL — CLOBETASOL PROP 0.05% OINT: 0.05 | 30 days supply | Qty: 45 | Fill #0

## 2019-05-04 MED FILL — CICLOPIROX 1% SHAMPOO: 1 | 90 days supply | Qty: 120 | Fill #0

## 2019-05-04 MED FILL — GLYCOPYRROLATE 1 MG TABS: 1 | 30 days supply | Qty: 60 | Fill #0

## 2019-05-07 ENCOUNTER — Other Ambulatory Visit: Payer: Self-pay

## 2019-05-07 ENCOUNTER — Ambulatory Visit (INDEPENDENT_AMBULATORY_CARE_PROVIDER_SITE_OTHER): Payer: 59 | Admitting: Psychology

## 2019-05-07 DIAGNOSIS — F411 Generalized anxiety disorder: Secondary | ICD-10-CM | POA: Diagnosis not present

## 2019-05-07 NOTE — Progress Notes (Addendum)
Office: [phone]  /  Fax: [phone]    Date: May 07, 2019    Appointment Start Time: 11:00am Duration: 33 minutes Provider: Glennie Isle, Psy.D. Type of Session: Individual Therapy  Location of Patient: Home Location of Provider: [facility] Type of Contact: Telepsychological Visit via [employer] WebEx   Session Content: Meleah is a 43 y.o. female presenting via [place] for a follow-up appointment to address the previously established treatment goal of decreasing emotional eating. Today's appointment was a telepsychological visit, as this provider's clinic is closed for in-person visits due to COVID-19. Therapeutic services will resume to in-person appointments once the clinic re-opens. Jomaira expressed understanding regarding the rationale for telepsychological services, and provided verbal consent for today's appointment. Prior to proceeding with today's appointment, Quintasha's physical location at the time of this appointment was obtained. Berneice reported she was at home and provided the address. In the event of technical difficulties, Barby shared a phone number she could be reached at. [name] and this provider participated in today's telepsychological service. Also, Shayana denied anyone else being present in the room or on the WebEx appointment.  This provider conducted a brief check-in and verbally administered the PHQ-9 and GAD-7. Neira shared, "The last two weeks have been pretty good." She discussed "taking a pause," which has helped "refocus." Brylee further discussed her parents helping with her children, which has allowed her to take care of tasks. Notably, Nefertiti acknowledged experiencing cravings; however, she explained eating foods she was craving when experiencing physical hunger. She further discussed using the hunger and satisfaction scale, and noted an increased ability to stop eating when she is physically satisfied; however, acknowledged overeating at times.  Additionally, she expressed difficulty with dinner in regard to carbohydrate intake. Thus, it was recommended she call the clinic to schedule an appointment with Dr. Leafy Ro to discuss the concern further; Allyssa agreed. Moreover, this provider discussed the importance of protein intake and its impact on fullness. Remainder of today's appointment focused on Mariachristina's ongoing worry regarding various aspects of her life. This provider and Tai discussed ways to develop short-term goals to assist with achieving longer-term goals, which may help with Jazminn's reported procrastination. Psychoeducation regarding should statements and all or nothing (cognitive distortions) was provided. Alashia reported an increase in alcohol intake to cope with anxiety. She denied it impacting her functioning and clarified that when she drinks, it is in the form of a standard drink. Nevertheless, she acknowledged, "That's what scares me."  As such, Silveria was led through a grounding exercise involving her senses to assist with coping and encouraged to engage in it as needed especially when experiencing all or nothing thinking and/or should statements. Denina agreed. Loris provided verbal consent during today's appointment for this provider to send a handout with today's exercise via e-mail. This provider and Kemani discussed termination planning. At the onset of therapeutic services the duration of treatment was discussed to be a total of 4 to 6 sessions; however, due to the pandemic, this provider will continue meeting with Allen as deemed necessary and appropriate. Asyia verbally acknowledged understanding of the aforementioned, and was receptive to double-checking insurance benefits. Overall, Poonam was receptive to today's session as evidenced by openness to sharing, responsiveness to feedback, and willingness to engage in the grounding technique.  Mental Status Examination:  Appearance: neat Behavior: cooperative Mood: euthymic Affect:  mood congruent Speech: normal in rate, volume, and tone Eye Contact: appropriate Psychomotor Activity: appropriate Thought Process: linear, logical, and goal  directed  Content/Perceptual Disturbances: denies suicidal and homicidal ideation, plan, and intent and no hallucinations, delusions, bizarre thinking or behavior reported or observed Orientation: time, person, place and purpose of appointment Cognition/Sensorium: memory, attention, language, and fund of knowledge intact  Insight: good Judgment: good  Structured Assessment Results: The Patient Health Questionnaire-9 (PHQ-9) is a self-report measure that assesses symptoms and severity of depression over the course of the last two weeks. Genasis obtained a score of 7 suggesting mild depression. Janice finds the endorsed symptoms to be somewhat difficult. Decreased interest 1  Down, depressed, hopeless 0  Altered sleeping 0  Tired, decreased energy 0  Change in appetite 3  Feeling bad or failure about yourself 1  Trouble concentrating 1  Moving slowly or fidgety/restless 1  Suicidal thoughts 0  PHQ-9 Score 7    The Generalized Anxiety Disorder-7 (GAD-7) is a brief self-report measure that assesses symptoms of anxiety over the course of the last two weeks. Kyli obtained a score of 6 suggesting mild anxiety. Alvetta finds the endorsed symptoms to be somewhat difficult. Nervous, anxious, on edge 1  Control/stop worrying 1  Worrying too much- different things 1  Trouble relaxing 1  Restless 1  Easily annoyed or irritable 0  Afraid-awful might happen 1  GAD-7 Score 6   Interventions:  Conducted a brief chart review Verbal administration of PHQ-9 and GAD-7 for symptom monitoring Provided empathic reflections and validation Engaged patient in problem solving Discussed termination planning Psychoeducation provided regarding cognitive distortions  Psychoeducation provided regarding grounding techniques Engaged patient in a grounding  technique Psychoeducation provided regarding the importance of protein intake  DSM-5 Diagnosis: 300.02 (F41.1) Generalized Anxiety Disorder  Treatment Goal & Progress: During the initial appointment with this provider, the following treatment goal was established: decrease emotional eating. Hailea has demonstrated some progress in her goal as evidenced by increased awareness of hunger patterns and triggers for emotional eating. Despite experiencing emotional eating, Laterria continues to demonstrate willingness to engage in learned skills.   Plan: Keiri continues to appear able and willing to participate as evidenced by engagement in reciprocal conversation, and asking questions for clarification as appropriate. Based on Domanique's self-report during today's appointment, the next appointment will be scheduled in one week, which will be via [employer]. Once this provider's office resumes in-person appointments, Mayara will be notified. The next session will focus on the introduction of other cognitive distortions, specifically as they relate to emotional eating. Mindfulness was not introduced today due to [place] presenting concerns. This provider will also discuss the option of a referral for longer-term therapeutic services.

## 2019-05-13 NOTE — Progress Notes (Signed)
Office: [phone]  /  Fax: [phone]    Date: May 14, 2019   Appointment Start Time: 11:01am Duration: 33 minutes Provider: Glennie Isle, Psy.D. Type of Session: Individual Therapy  Location of Patient: Home Location of Provider: Provider's Home Type of Contact: Telepsychological Visit via [employer] Web[employer]   Session Content: [name] is a 43 y.o. female presenting via [place] for a follow-up appointment to address the previously established treatment goal of decreasing emotional eating. Today's appointment was a telepsychological visit, as this provider's clinic is seeing a limited number of patients for in-person visits due to COVID-19. Therapeutic services will resume to in-person appointments once deemed appropriate. [name] expressed understanding regarding the rationale for telepsychological services, and provided verbal consent for today's appointment. Prior to proceeding with today's appointment, [name]'s physical location at the time of this appointment was obtained. [name] reported she was at home and provided the address. In the event of technical difficulties, [name] shared a phone number she could be reached at. [name] and this provider participated in today's telepsychological service. Also, [name] denied anyone else being present in the room or on the WebEx appointment.  This provider conducted a brief check-in and verbally administered the PHQ-9 and GAD-7. [name] shared she is feeling better this week and noted a reduction in stress. [name] indicated she engaged in the previously discussed grounding technique and noted an increased ability to cope as a result. She also discussed not feeling rushed at dinner time and she noted an increased ability to make better choices. [name] further indicated a reduction in alcohol intake. Moreover, she indicated setting reminders on her phone to study, which she described as helpful and discussed her children had also been understanding regarding her time  to study. Nevertheless, she reported experiencing anticipatory anxiety related to her ability to succeed next week. Thus, she was led through the 5-4-3-2-1 grounding exercise during the appointment. Regarding emotional eating, [name] reported a reduction as she did not experience "as much anxiety." This was reflected in the context of her increased ability to cope and positively reinforced. Furthermore, psychoeducation regarding common cognitive distortions associated with emotional eating was provided. [name] provided verbal consent during today's appointment for this provider to send the handout for cognitive distortions via e-mail. This provider and [name] also discussed using learned skills to cope when she identifies the presence of the discussed cognitive distortions. Overall, [name] was receptive to today's session as evidenced by openness to sharing, responsiveness to feedback, and continues to demonstrate willingness to engage in learned skills.  Mental Status Examination:  Appearance: neat Behavior: cooperative Mood: euthymic Affect: mood congruent Speech: normal in rate, volume, and tone Eye Contact: appropriate Psychomotor Activity: appropriate Thought Process: linear, logical, and goal directed  Content/Perceptual Disturbances: denies suicidal and homicidal ideation, plan, and intent and no hallucinations, delusions, bizarre thinking or behavior reported or observed Orientation: time, person, place and purpose of appointment Cognition/Sensorium: memory, attention, language, and fund of knowledge intact  Insight: good Judgment: good  Structured Assessment Results: The Patient Health Questionnaire-9 (PHQ-9) is a self-report measure that assesses symptoms and severity of depression over the course of the last two weeks; however, today's administration was based on the past week. Rigby obtained a score of 2 suggesting minimal depression. [name] finds the endorsed symptoms to be not difficult at  all. Decreased interest 0  Down, depressed, hopeless 0  Altered sleeping 0  Tired, decreased energy 0  Change in appetite 1  Feeling bad or failure about yourself 0  Trouble concentrating 1  Moving slowly or fidgety/restless 0  Suicidal thoughts 0  PHQ-9 Score 2    The Generalized Anxiety Disorder-7 (GAD-7) is a brief self-report measure that assesses symptoms of anxiety over the course of the last two weeks; however, today's administration was based on the past week. [name] obtained a score of 1 suggesting minimal anxiety. [name] finds the endorsed symptoms to be not difficult at all. Nervous, anxious, on edge 1  Control/stop worrying 0  Worrying too much- different things 0  Trouble relaxing 0  Restless 0  Easily annoyed or irritable 0  Afraid-awful might happen 0  GAD-7 Score 1   Interventions:  Conducted a brief chart review Verbal administration of PHQ-9 and GAD-7 for symptom monitoring Provided empathic reflections and validation Reviewed content from the previous session Psychoeducation provided regarding cognitive distortions  Reviewed learned skills Provided positive reinforcement Employed supportive psychotherapy interventions to facilitate reduced distress, and to improve coping skills with identified stressors Employed acceptance and commitment interventions to emphasize mindfulness and acceptance without struggle Engaged patient in a grounding technique  DSM-5 Diagnosis: 300.02 (F41.1) Generalized Anxiety Disorder  Treatment Goal & Progress: During the initial appointment with this provider, the following treatment goal was established: decrease emotional eating. [name] has demonstrated progress in her goal as evidenced by increased awareness of hunger patterns and triggers for emotional eating. With an increased ability to cope recently, [name] noted a reduction in emotional eating. She also reported utilization of learned skills.   Plan: [name] continues to appear able  and willing to participate as evidenced by engagement in reciprocal conversation, and asking questions for clarification as appropriate. The next appointment will be scheduled in two weeks, which will be via [employer]. Once this provider's office resumes in-person appointments and it is deemed appropriate, Ravleen will be notified. The next session will focus on the introduction of mindfulness. Based on improvements, this provider and Carrianne did not discuss a referral for longer-term therapeutic services. The option will be discussed in the future if deemed necessary.

## 2019-05-14 ENCOUNTER — Ambulatory Visit (INDEPENDENT_AMBULATORY_CARE_PROVIDER_SITE_OTHER): Payer: 59 | Admitting: Psychology

## 2019-05-14 DIAGNOSIS — F411 Generalized anxiety disorder: Secondary | ICD-10-CM

## 2019-05-18 ENCOUNTER — Encounter (INDEPENDENT_AMBULATORY_CARE_PROVIDER_SITE_OTHER): Payer: Self-pay | Admitting: Family Medicine

## 2019-05-18 ENCOUNTER — Other Ambulatory Visit: Payer: Self-pay

## 2019-05-18 ENCOUNTER — Ambulatory Visit (INDEPENDENT_AMBULATORY_CARE_PROVIDER_SITE_OTHER): Payer: 59 | Admitting: Family Medicine

## 2019-05-18 DIAGNOSIS — E559 Vitamin D deficiency, unspecified: Secondary | ICD-10-CM | POA: Diagnosis not present

## 2019-05-18 DIAGNOSIS — Z6838 Body mass index (BMI) 38.0-38.9, adult: Secondary | ICD-10-CM

## 2019-05-18 DIAGNOSIS — F3289 Other specified depressive episodes: Secondary | ICD-10-CM

## 2019-05-18 MED ORDER — VITAMIN D (ERGOCALCIFEROL) 1.25 MG (50000 UNIT) PO CAPS: 50000.0000 [IU] | ORAL_CAPSULE | ORAL | 0 refills | Status: DC

## 2019-05-18 MED ORDER — INSULIN PEN NEEDLE 32G X 4 MM MISC: 1.0000 | Freq: Two times a day (BID) | 0 refills | Status: DC

## 2019-05-18 MED ORDER — BUPROPION HCL ER (SR) 150 MG PO TB12: 150.0000 mg | ORAL_TABLET | Freq: Two times a day (BID) | ORAL | 0 refills | Status: DC

## 2019-05-18 MED ORDER — LIRAGLUTIDE -WEIGHT MANAGEMENT 18 MG/3ML ~~LOC~~ SOPN: 3.0000 mg | PEN_INJECTOR | Freq: Every day | SUBCUTANEOUS | 0 refills | Status: DC

## 2019-05-18 MED FILL — VIT D2 1.25 MG (50,000 UNIT: 1.25 MG | 28 days supply | Qty: 4 | Fill #0

## 2019-05-18 MED FILL — BUPROPION HCL SR 150 MG TAB: 150 | 30 days supply | Qty: 60 | Fill #0

## 2019-05-18 NOTE — Progress Notes (Signed)
Office: [phone]  /  Fax: [phone] TeleHealth Visit:  [name] has verbally consented to this TeleHealth visit today. The patient is located at home, the provider is located at the [employer] [facility] office. The participants in this visit include the listed provider and patient. [name] was unable to use Webex today and the telehealth visit was conducted via telephone.  HPI:   Chief Complaint: OBESITY [name] is here to discuss her progress with her obesity treatment plan. She is keeping a food journal with 1600 calories and 90 grams of protein and is following her eating plan approximately 30 % of the time. She states she is walking 30 minutes 2 to 3 times per week. [name] has been doing well maintaining her weight, but she has struggled with the food journaling. She isn't making her protein goals and recognizing that her simple carbs are still too high. She is doing well on Saxenda and denies nausea and vomiting.   We were unable to weigh the patient today for this TeleHealth visit. She feels as if she has gained weight since her last visit. She has lost 2 lbs since starting treatment with [place].  Vitamin D Deficiency [name] has a diagnosis of vitamin D deficiency. She is currently stable on vit D, but is not yet at goal. [name] denies nausea, vomiting, or muscle weakness.  Depression with Anxiety [name] has been doing therapy with Dr. Mallie Mussel concerning her emotional eating and anxiety. She is struggling with emotional eating and using food for comfort to the extent that it is negatively impacting her health. She often snacks when she is not hungry. [name] sometimes feels she is out of control and then feels guilty that she made poor food choices. She has been working on behavior modification techniques to help reduce her emotional eating and has been somewhat successful.  Depression screen [facility] 2/9 04/02/2019 01/26/2019 10/20/2018 09/23/2018 09/11/2018  Decreased Interest 0 0 0 0 3   Down, Depressed, Hopeless 1 1 0 0 0  PHQ - 2 Score 1 1 0 0 3  Altered sleeping 0 0 - 0 2  Tired, decreased energy 0 1 - 1 3  Change in appetite 2 1 - 1 3  Feeling bad or failure about yourself  0 0 - 0 2  Trouble concentrating 3 1 - 1 3  Moving slowly or fidgety/restless 3 0 - 0 0  Suicidal thoughts 0 0 - 0 0  PHQ-9 Score 9 4 - 3 16  Difficult doing work/chores - - - - Not difficult at all   ASSESSMENT AND PLAN:  Vitamin D deficiency - Plan: Vitamin D, Ergocalciferol, (DRISDOL) 1.25 MG (50000 UT) CAPS capsule  Other depression - with emotional eating - Plan: buPROPion (WELLBUTRIN SR) 150 MG 12 hr tablet  Class 2 severe obesity with serious comorbidity and body mass index (BMI) of 38.0 to 38.9 in adult, unspecified obesity type (HCC)  PLAN:  Vitamin D Deficiency [name] was informed that low vitamin D levels contribute to fatigue and are associated with obesity, breast, and colon cancer. [name] agrees to continue to take prescription Vit D @50 ,000 IU every week #4 with no refills and will follow up for routine testing of vitamin D, at least 2-3 times per year. She was informed of the risk of over-replacement of vitamin D and agrees to not increase her dose unless she discusses this with [place] first. [name] agrees to follow up in 2 weeks as directed.  Depression with Anxiety  We discussed behavior modification techniques today to help [name] deal with her emotional eating and depression. She has agreed to take Wellbutrin SR 150 mg BID #60 with no refills and agreed to follow up as directed.  I spent > than 50% of the 25 minute visit on counseling as documented in the note.  Obesity [name] is currently in the action stage of change. As such, her goal is to continue with weight loss efforts. She has agreed to keep a food journal with 1600 calories and 90+ grams of protein.  [name] has been instructed to work up to a goal of 150 minutes of combined cardio and strengthening exercise per week for  weight loss and overall health benefits. We discussed the following Behavioral Modification Strategies today: increasing lean protein intake, decreasing simple carbohydrates, and work on meal planning and easy cooking plans. We discussed various medication options to help [name] with her weight loss efforts and we both agreed to restart Saxenda 3.0 mg qd #5 pens and nano needles #100 with no refills.  [name] has agreed to follow up with our clinic in 2 weeks. She was informed of the importance of frequent follow up visits to maximize her success with intensive lifestyle modifications for her multiple health conditions.  ALLERGIES: No Known Allergies  MEDICATIONS: Current Outpatient Medications on File Prior to Visit  Medication Sig Dispense Refill  . ibuprofen (ADVIL,MOTRIN) 600 MG tablet 1  po  pc every 6 hours for 5 days then as needed for pain 30 tablet 0  . polyethylene glycol (MIRALAX / GLYCOLAX) packet Take 17 g by mouth daily.     No current facility-administered medications on file prior to visit.     PAST MEDICAL HISTORY: Past Medical History:  Diagnosis Date  . Anxiety   . Constipation   . Constipation   . Joint pain   . Lactose intolerance   . Nervousness   . Obesity   . PCOS (polycystic ovarian syndrome)   . Sickle cell trait ([place])   . Vaginal delivery 1997, 2005, 2011  . Vitamin D deficiency     PAST SURGICAL HISTORY: Past Surgical History:  Procedure Laterality Date  . CERVICAL CERCLAGE    . CYSTOSCOPY  09/19/2016   Procedure: CYSTOSCOPY;  Surgeon: Everett Graff, MD;  Location: [facility];  Service: Gynecology;;  . LAPAROSCOPIC ASSISTED VAGINAL HYSTERECTOMY N/A 09/19/2016   Procedure: LAPAROSCOPIC ASSISTED VAGINAL HYSTERECTOMY Bilateral Sapingectomy;  Surgeon: Everett Graff, MD;  Location: [facility];  Service: Gynecology;  Laterality: N/A;  . TONSILLECTOMY AND ADENOIDECTOMY  2009    SOCIAL HISTORY: Social History   Tobacco Use  . Smoking status: Never Smoker  .  Smokeless tobacco: Never Used  Substance Use Topics  . Alcohol use: Yes    Comment: wine  . Drug use: No    FAMILY HISTORY: Family History  Problem Relation Age of Onset  . Hypertension Mother   . Alcoholism Mother   . Drug abuse Mother   . Hyperlipidemia Father   . Alcoholism Father   . Drug abuse Father   . Breast cancer Neg Hx     ROS: Review of Systems  Gastrointestinal: Negative for nausea and vomiting.  Musculoskeletal:       Negative for muscle weakness.  Psychiatric/Behavioral: Positive for depression. The patient is nervous/anxious.     PHYSICAL EXAM: Pt in no acute distress  RECENT LABS AND TESTS: BMET    Component Value Date/Time   NA 138 12/08/2018 1002   K  4.4 12/08/2018 1002   CL 103 12/08/2018 1002   CO2 22 12/08/2018 1002   GLUCOSE 101 (H) 12/08/2018 1002   GLUCOSE 80 09/13/2016 0908   GLUCOSE 84 07/27/2010 0505   BUN 13 12/08/2018 1002   CREATININE 0.93 12/08/2018 1002   CALCIUM 9.3 12/08/2018 1002   GFRNONAA 77 12/08/2018 1002   GFRAA 89 12/08/2018 1002   Lab Results  Component Value Date   HGBA1C 5.0 12/08/2018   HGBA1C 5.2 09/11/2018   HGBA1C 5.1 06/12/2018   Lab Results  Component Value Date   INSULIN 9.0 12/08/2018   INSULIN 9.7 09/11/2018   CBC    Component Value Date/Time   WBC 4.3 09/11/2018 1201   WBC 10.4 09/20/2016 0610   RBC 4.38 09/11/2018 1201   RBC 3.79 (L) 09/20/2016 0610   HGB 13.0 09/11/2018 1201   HCT 39.5 09/11/2018 1201   PLT 286 09/20/2016 0610   MCV 90 09/11/2018 1201   MCH 29.7 09/11/2018 1201   MCH 29.0 09/20/2016 0610   MCHC 32.9 09/11/2018 1201   MCHC 35.0 09/20/2016 0610   RDW 13.7 09/11/2018 1201   LYMPHSABS 1.4 09/11/2018 1201   MONOABS 0.3 07/22/2010 0728   EOSABS 0.1 09/11/2018 1201   BASOSABS 0.0 09/11/2018 1201   Iron/TIBC/Ferritin/ %Sat No results found for: IRON, TIBC, FERRITIN, IRONPCTSAT Lipid Panel     Component Value Date/Time   CHOL 194 12/08/2018 1002   TRIG 78 12/08/2018  1002   HDL 59 12/08/2018 1002   LDLCALC 119 (H) 12/08/2018 1002   Hepatic Function Panel     Component Value Date/Time   PROT 7.0 12/08/2018 1002   ALBUMIN 4.4 12/08/2018 1002   AST 14 12/08/2018 1002   ALT 11 12/08/2018 1002   ALKPHOS 54 12/08/2018 1002   BILITOT 0.4 12/08/2018 1002      Component Value Date/Time   TSH 0.733 09/11/2018 1201   TSH 1.091 05/19/2012 1140   Results for Helvey, Leyton L (MRN [id number]) as of 05/18/2019 14:44  Ref. Range 12/08/2018 10:02  Vitamin D, 25-Hydroxy Latest Ref Range: 30.0 - 100.0 ng/mL 36.9     I, Marcille Blanco, CMA, am acting as transcriptionist for Starlyn Skeans, MD I have reviewed the above documentation for accuracy and completeness, and I agree with the above. -Dennard Nip, MD

## 2019-05-19 DIAGNOSIS — F32A Depression, unspecified: Secondary | ICD-10-CM | POA: Insufficient documentation

## 2019-05-19 DIAGNOSIS — F329 Major depressive disorder, single episode, unspecified: Secondary | ICD-10-CM | POA: Insufficient documentation

## 2019-05-26 ENCOUNTER — Encounter (INDEPENDENT_AMBULATORY_CARE_PROVIDER_SITE_OTHER): Payer: Self-pay | Admitting: Family Medicine

## 2019-05-27 NOTE — Progress Notes (Signed)
Office: [phone]  /  Fax: [phone]    Date: May 28, 2019   Appointment Start Time: 10:35am Duration: 24 minutes Provider: Glennie Isle, Psy.D. Type of Session: Individual Therapy  Location of Patient: Home Location of Provider: Provider's Home Type of Contact: Telepsychological Visit via [employer] WebEx   Session Content: Shuronda is a 43 y.o. female presenting via [employer] for a follow-up appointment to address the previously established treatment goal of decreasing emotional eating. Of note, this provider called Jaemarie at 10:32 as she did not present for the [facility] appointment. Mirabel indicated she was experiencing difficulty connecting and the e-mail with the secure link was re-sent. As such, today's appointment was initiated 5 minutes late. Today's appointment was a telepsychological visit, as this provider's clinic is seeing a limited number of patients for in-person visits due to COVID-19. Therapeutic services will resume to in-person appointments once deemed appropriate. Angelly expressed understanding regarding the rationale for telepsychological services, and provided verbal consent for today's appointment. Prior to proceeding with today's appointment, Zela's physical location at the time of this appointment was obtained. Ottilia reported she was at home and provided the address. In the event of technical difficulties, Onyx shared a phone number she could be reached at. [name] and this provider participated in today's telepsychological service. Also, Pansey denied anyone else being present in the room or on the WebEx appointment.  This provider conducted a brief check-in and verbally administered the PHQ-9 and GAD-7. Briggitte shared she was prescribed Wellbutrin and Saxenda. Since the last appointment with this provider, Tannie reported engaging in meal prepping and described feeling "proud" of herself. Additionally, she discussed feeling as though she has lost weight. Kaylina denied episodes of  emotional eating since the last appointment with this provider. Moreover, Tylea discussed engaging in the grounding technique previously discussed and it has helped to avoid "spiraling." She also engaged in positive self-talk. The aforementioned was positively reinforced. Psychoeducation regarding mindfulness was provided. A handout was provided to Marsi via e-mail with further information regarding mindfulness, including exercises. This provider also explained the benefit of mindfulness as it relates to emotional eating. Alonna provided verbal consent during today's appointment for this provider to send the handout on mindfulness via e-mail. Psychoeducation regarding formal versus information mindfulness practice was also provided.This provider discussed the utilization of YouTube for mindfulness exercises, specifically videos [place] for formal mindfulness. Sammantha was asked to engage in one mindfulness exercise a day for homework between now and the next appointment; Antonella agreed. This provider also discussed termination planning. Shanice was receptive to reducing the frequency of appointments and she noted, "I have a tool box." The next appointment will be scheduled in 3 weeks and a subsequent appointment will be scheduled in 3-4 weeks, which may include termination if progress continues. Overall, Gabriellia was receptive to today's session as evidenced by openness to sharing, responsiveness to feedback, and willingness to continue engaging in learned skills to assist with coping.  Mental Status Examination:  Appearance: neat Behavior: cooperative Mood: euthymic Affect: mood congruent Speech: normal in rate, volume, and tone Eye Contact: appropriate Psychomotor Activity: appropriate Thought Process: linear, logical, and goal directed  Content/Perceptual Disturbances: denies suicidal and homicidal ideation, plan, and intent and no hallucinations, delusions, bizarre thinking or behavior reported or observed  Orientation: time, person, place and purpose of appointment Cognition/Sensorium: memory, attention, language, and fund of knowledge intact  Insight: good Judgment: good  Structured Assessment Results: The Patient Health Questionnaire-9 (PHQ-9) is a self-report measure that assesses  symptoms and severity of depression over the course of the last two weeks. [place] obtained a score of 1 suggesting minimal depression. Farha finds the endorsed symptoms to be somewhat difficult. Decreased interest 0  Down, depressed, hopeless 0  Altered sleeping 0  Tired, decreased energy 0  Change in appetite 0  Feeling bad or failure about yourself 0  Trouble concentrating 1  Moving slowly or fidgety/restless 0  Suicidal thoughts 0  PHQ-9 Score 1    The Generalized Anxiety Disorder-7 (GAD-7) is a brief self-report measure that assesses symptoms of anxiety over the course of the last two weeks. Camia obtained a score of 2 suggesting minimal anxiety. Brandy finds the endorsed symptoms to be somewhat difficult. Nervous, anxious, on edge 1  Control/stop worrying 0  Worrying too much- different things 0  Trouble relaxing 0  Restless 0  Easily annoyed or irritable 1  Afraid-awful might happen 0  GAD-7 Score 2   Interventions:  Conducted a brief chart review Verbal administration of PHQ-9 and GAD-7 for symptom monitoring Provided empathic reflections and validation Reviewed content from the previous session Psychoeducation provided regarding mindfulness Discussed termination planning Provided positive reinforcement Employed supportive psychotherapy interventions to facilitate reduced distress, and to improve coping skills with identified stressors Psychoeducation provided regarding informal versus formal mindfulnes practice   DSM-5 Diagnosis: 300.02 (F41.1) Generalized Anxiety Disorder  Treatment Goal & Progress: During the initial appointment with this provider, the following treatment goal was  established: decrease emotional eating. Inetha has demonstrated progress in her goal as evidenced by increased awareness of hunger patterns and triggers for emotional eating. Geraldyn noted a reduction in emotional eating since the onset of treatment and also discussed engagement in learned skills to assist with coping.   Plan: Aimar continues to appear able and willing to participate as evidenced by engagement in reciprocal conversation, and asking questions for clarification as appropriate. The next appointment will be scheduled in three weeks, which will be via [employer]. Once this provider's office resumes in-person appointments and it is deemed appropriate, Tamirah will be notified. The next session will focus further on mindfulness.

## 2019-05-28 ENCOUNTER — Other Ambulatory Visit: Payer: Self-pay

## 2019-05-28 ENCOUNTER — Ambulatory Visit (INDEPENDENT_AMBULATORY_CARE_PROVIDER_SITE_OTHER): Payer: 59 | Admitting: Psychology

## 2019-05-28 DIAGNOSIS — F411 Generalized anxiety disorder: Secondary | ICD-10-CM

## 2019-05-28 MED FILL — SAXENDA 18 MG/3 ML PEN: 18 | 30 days supply | Qty: 15 | Fill #0

## 2019-06-01 ENCOUNTER — Ambulatory Visit (INDEPENDENT_AMBULATORY_CARE_PROVIDER_SITE_OTHER): Payer: 59 | Admitting: Family Medicine

## 2019-06-01 ENCOUNTER — Other Ambulatory Visit: Payer: Self-pay

## 2019-06-01 ENCOUNTER — Encounter (INDEPENDENT_AMBULATORY_CARE_PROVIDER_SITE_OTHER): Payer: Self-pay | Admitting: Family Medicine

## 2019-06-01 DIAGNOSIS — F3289 Other specified depressive episodes: Secondary | ICD-10-CM

## 2019-06-01 DIAGNOSIS — Z6838 Body mass index (BMI) 38.0-38.9, adult: Secondary | ICD-10-CM | POA: Diagnosis not present

## 2019-06-01 NOTE — Progress Notes (Signed)
Office: [phone]  /  Fax: [phone] TeleHealth Visit:  [name] has verbally consented to this TeleHealth visit today. The patient is located in the car, the provider is located at the [employer] [facility] office. The participants in this visit include the listed provider and patient. [name] was unable to use realtime audiovisual technology today and the telehealth visit was conducted via telephone.  HPI:   Chief Complaint: OBESITY [name] is here to discuss her progress with her obesity treatment plan. She is on the  keep a food journal with 1600 calories and 90g of protein  daily and is following her eating plan approximately 50 % of the time. She states she is exercising 0 minutes 0 times per week. [name] has been journaling on and off in the last 2 weeks. She I still on Saxenda and feels it has helped but sometimes she is not always eating all her food due to decreased hunger. .  We were unable to weigh the patient today for this TeleHealth visit. She feels as if she has lost weight since her last visit. She has lost 2 lbs since starting treatment with [place].  Depression with emotional eating behaviors [name] is struggling with emotional eating and using food for comfort to the extent that it is negatively impacting her health. She often snacks when she is not hungry. [name] sometimes feels she is out of control and then feels guilty that she made poor food choices. She has been working on behavior modification techniques to help reduce her emotional eating and has been somewhat successful. She shows no sign of suicidal or homicidal ideations. She started on Wellbutrin, her energy has improved, mood is good, and she feels she has more control of emotional eating. She is doing therapy with Dr. Mallie Mussel and thinks this has helped.   Depression screen [facility] 2/9 04/02/2019 01/26/2019 10/20/2018 09/23/2018 09/11/2018  Decreased Interest 0 0 0 0 3  Down, Depressed, Hopeless 1 1 0 0 0  PHQ - 2  Score 1 1 0 0 3  Altered sleeping 0 0 - 0 2  Tired, decreased energy 0 1 - 1 3  Change in appetite 2 1 - 1 3  Feeling bad or failure about yourself  0 0 - 0 2  Trouble concentrating 3 1 - 1 3  Moving slowly or fidgety/restless 3 0 - 0 0  Suicidal thoughts 0 0 - 0 0  PHQ-9 Score 9 4 - 3 16  Difficult doing work/chores - - - - Not difficult at all     ASSESSMENT AND PLAN:  Other depression  Class 2 severe obesity with serious comorbidity and body mass index (BMI) of 38.0 to 38.9 in adult, unspecified obesity type (HCC)  PLAN: Depression with Emotional Eating Behaviors We discussed behavior modification techniques today to help [name] deal with her emotional eating and depression. She has agreed to continue Wellbutrin SR 150 mg bid and continue with therapy. She agreed to follow up as directed.  I spent > than 50% of the 15 minute visit on counseling as documented in the note.  Obesity [name] is currently in the action stage of change. As such, her goal is to continue with weight loss efforts She has agreed to keep a food journal with 1600 calories and 90g of  protein daily.  [name] has been instructed to work up to a goal of 150 minutes of combined cardio and strengthening exercise per week for weight loss and overall health  benefits. We discussed the following Behavioral Modification Stratagies today: increasing lean protein intake, no skipping meals, keeping healthy foods in the home, and keeping a strict food journal.  We discussed various medication options to help [name] with her weight loss efforts and we both agreed to decrease saxenda to 2.4 mg and see if this lets her eat what she needs to but keep her from overeating.   [name] has agreed to follow up with our clinic in 2 weeks. She was informed of the importance of frequent follow up visits to maximize her success with intensive lifestyle modifications for her multiple health conditions.  ALLERGIES: No Known Allergies   MEDICATIONS: Current Outpatient Medications on File Prior to Visit  Medication Sig Dispense Refill  . buPROPion (WELLBUTRIN SR) 150 MG 12 hr tablet Take 1 tablet (150 mg total) by mouth 2 (two) times daily. 60 tablet 0  . ibuprofen (ADVIL,MOTRIN) 600 MG tablet 1  po  pc every 6 hours for 5 days then as needed for pain 30 tablet 0  . Insulin Pen Needle (BD PEN NEEDLE NANO 2ND GEN) 32G X 4 MM MISC 1 Package by Does not apply route 2 (two) times daily. 100 each 0  . Liraglutide -Weight Management (SAXENDA) 18 MG/3ML SOPN Inject 3 mg into the skin daily. 5 pen 0  . polyethylene glycol (MIRALAX / GLYCOLAX) packet Take 17 g by mouth daily.    . Vitamin D, Ergocalciferol, (DRISDOL) 1.25 MG (50000 UT) CAPS capsule Take 1 capsule (50,000 Units total) by mouth every 7 (seven) days. 4 capsule 0   No current facility-administered medications on file prior to visit.     PAST MEDICAL HISTORY: Past Medical History:  Diagnosis Date  . Anxiety   . Constipation   . Constipation   . Joint pain   . Lactose intolerance   . Nervousness   . Obesity   . PCOS (polycystic ovarian syndrome)   . Sickle cell trait ([place])   . Vaginal delivery 1997, 2005, 2011  . Vitamin D deficiency     PAST SURGICAL HISTORY: Past Surgical History:  Procedure Laterality Date  . CERVICAL CERCLAGE    . CYSTOSCOPY  09/19/2016   Procedure: CYSTOSCOPY;  Surgeon: Everett Graff, MD;  Location: [facility];  Service: Gynecology;;  . LAPAROSCOPIC ASSISTED VAGINAL HYSTERECTOMY N/A 09/19/2016   Procedure: LAPAROSCOPIC ASSISTED VAGINAL HYSTERECTOMY Bilateral Sapingectomy;  Surgeon: Everett Graff, MD;  Location: [facility];  Service: Gynecology;  Laterality: N/A;  . TONSILLECTOMY AND ADENOIDECTOMY  2009    SOCIAL HISTORY: Social History   Tobacco Use  . Smoking status: Never Smoker  . Smokeless tobacco: Never Used  Substance Use Topics  . Alcohol use: Yes    Comment: wine  . Drug use: No    FAMILY HISTORY: Family History  Problem  Relation Age of Onset  . Hypertension Mother   . Alcoholism Mother   . Drug abuse Mother   . Hyperlipidemia Father   . Alcoholism Father   . Drug abuse Father   . Breast cancer Neg Hx     ROS: Review of Systems  Psychiatric/Behavioral: Positive for depression. Negative for suicidal ideas.    PHYSICAL EXAM: Pt in no acute distress  RECENT LABS AND TESTS: BMET    Component Value Date/Time   NA 138 12/08/2018 1002   K 4.4 12/08/2018 1002   CL 103 12/08/2018 1002   CO2 22 12/08/2018 1002   GLUCOSE 101 (H) 12/08/2018 1002   GLUCOSE 80 09/13/2016 0908  GLUCOSE 84 07/27/2010 0505   BUN 13 12/08/2018 1002   CREATININE 0.93 12/08/2018 1002   CALCIUM 9.3 12/08/2018 1002   GFRNONAA 77 12/08/2018 1002   GFRAA 89 12/08/2018 1002   Lab Results  Component Value Date   HGBA1C 5.0 12/08/2018   HGBA1C 5.2 09/11/2018   HGBA1C 5.1 06/12/2018   Lab Results  Component Value Date   INSULIN 9.0 12/08/2018   INSULIN 9.7 09/11/2018   CBC    Component Value Date/Time   WBC 4.3 09/11/2018 1201   WBC 10.4 09/20/2016 0610   RBC 4.38 09/11/2018 1201   RBC 3.79 (L) 09/20/2016 0610   HGB 13.0 09/11/2018 1201   HCT 39.5 09/11/2018 1201   PLT 286 09/20/2016 0610   MCV 90 09/11/2018 1201   MCH 29.7 09/11/2018 1201   MCH 29.0 09/20/2016 0610   MCHC 32.9 09/11/2018 1201   MCHC 35.0 09/20/2016 0610   RDW 13.7 09/11/2018 1201   LYMPHSABS 1.4 09/11/2018 1201   MONOABS 0.3 07/22/2010 0728   EOSABS 0.1 09/11/2018 1201   BASOSABS 0.0 09/11/2018 1201   Iron/TIBC/Ferritin/ %Sat No results found for: IRON, TIBC, FERRITIN, IRONPCTSAT Lipid Panel     Component Value Date/Time   CHOL 194 12/08/2018 1002   TRIG 78 12/08/2018 1002   HDL 59 12/08/2018 1002   LDLCALC 119 (H) 12/08/2018 1002   Hepatic Function Panel     Component Value Date/Time   PROT 7.0 12/08/2018 1002   ALBUMIN 4.4 12/08/2018 1002   AST 14 12/08/2018 1002   ALT 11 12/08/2018 1002   ALKPHOS 54 12/08/2018 1002    BILITOT 0.4 12/08/2018 1002      Component Value Date/Time   TSH 0.733 09/11/2018 1201   TSH 1.091 05/19/2012 1140      I, Renee Ramus, am acting as Location manager for Dennard Nip, MD  I have reviewed the above documentation for accuracy and completeness, and I agree with the above. -Dennard Nip, MD

## 2019-06-03 ENCOUNTER — Other Ambulatory Visit: Payer: Self-pay

## 2019-06-03 ENCOUNTER — Encounter: Payer: Self-pay | Admitting: Internal Medicine

## 2019-06-03 ENCOUNTER — Ambulatory Visit: Payer: 59 | Admitting: Internal Medicine

## 2019-06-03 VITALS — BP 132/86 | HR 105 | Temp 98.9°F | Ht 67.0 in | Wt 242.4 lb

## 2019-06-03 DIAGNOSIS — Z020 Encounter for examination for admission to educational institution: Secondary | ICD-10-CM | POA: Diagnosis not present

## 2019-06-03 DIAGNOSIS — R635 Abnormal weight gain: Secondary | ICD-10-CM | POA: Diagnosis not present

## 2019-06-03 DIAGNOSIS — E6609 Other obesity due to excess calories: Secondary | ICD-10-CM | POA: Diagnosis not present

## 2019-06-03 DIAGNOSIS — R030 Elevated blood-pressure reading, without diagnosis of hypertension: Secondary | ICD-10-CM | POA: Diagnosis not present

## 2019-06-03 DIAGNOSIS — Z6837 Body mass index (BMI) 37.0-37.9, adult: Secondary | ICD-10-CM

## 2019-06-03 DIAGNOSIS — Z0110 Encounter for examination of ears and hearing without abnormal findings: Secondary | ICD-10-CM | POA: Diagnosis not present

## 2019-06-03 DIAGNOSIS — Z0100 Encounter for examination of eyes and vision without abnormal findings: Secondary | ICD-10-CM

## 2019-06-03 NOTE — Patient Instructions (Addendum)
Magnesium 400mg  nightly --- you may get this over the counter  [employer]--walking program - [employer]   Exercising to [employer] Exercise is structured, repetitive physical activity to improve fitness and health. Getting regular exercise is important for everyone. It is especially important if you are overweight. Being overweight increases your risk of heart disease, stroke, diabetes, high blood pressure, and several types of cancer. Reducing your calorie intake and exercising can help you lose weight. Exercise is usually categorized as moderate or vigorous intensity. To lose weight, most people need to do a certain amount of moderate-intensity or vigorous-intensity exercise each week. Moderate-intensity exercise  Moderate-intensity exercise is any activity that gets you moving enough to burn at least three times more energy (calories) than if you were sitting. Examples of moderate exercise include:  Walking a mile in 15 minutes.  Doing light yard work.  Biking at an easy pace. Most people should get at least 150 minutes (2 hours and 30 minutes) a week of moderate-intensity exercise to maintain their body weight. Vigorous-intensity exercise Vigorous-intensity exercise is any activity that gets you moving enough to burn at least six times more calories than if you were sitting. When you exercise at this intensity, you should be working hard enough that you are not able to carry on a conversation. Examples of vigorous exercise include:  Running.  Playing a team sport, such as football, basketball, and soccer.  Jumping rope. Most people should get at least 75 minutes (1 hour and 15 minutes) a week of vigorous-intensity exercise to maintain their body weight. How can exercise affect me? When you exercise enough to burn more calories than you eat, you lose weight. Exercise also reduces body fat and builds muscle. The more muscle you have, the more calories you burn. Exercise  also:  Improves mood.  Reduces stress and tension.  Improves your overall fitness, flexibility, and endurance.  Increases bone strength. The amount of exercise you need to lose weight depends on:  Your age.  The type of exercise.  Any health conditions you have.  Your overall physical ability. Talk to your health care provider about how much exercise you need and what types of activities are safe for you. What actions can I take to lose weight? Nutrition   Make changes to your diet as told by your health care provider or diet and nutrition specialist (dietitian). This may include: ? Eating fewer calories. ? Eating more protein. ? Eating less unhealthy fats. ? Eating a diet that includes fresh fruits and vegetables, whole grains, low-fat dairy products, and lean protein. ? Avoiding foods with added fat, salt, and sugar.  Drink plenty of water while you exercise to prevent dehydration or heat stroke. Activity  Choose an activity that you enjoy and set realistic goals. Your health care provider can help you make an exercise plan that works for you.  Exercise at a moderate or vigorous intensity most days of the week. ? The intensity of exercise may vary from person to person. You can tell how intense a workout is for you by paying attention to your breathing and heartbeat. Most people will notice their breathing and heartbeat get faster with more intense exercise.  Do resistance training twice each week, such as: ? Push-ups. ? Sit-ups. ? Lifting weights. ? Using resistance bands.  Getting short amounts of exercise can be just as helpful as long structured periods of exercise. If you have trouble finding time to exercise, try to include exercise  in your daily routine. ? Get up, stretch, and walk around every 30 minutes throughout the day. ? Go for a walk during your lunch break. ? Park your car farther away from your destination. ? If you take public transportation, get off  one stop early and walk the rest of the way. ? Make phone calls while standing up and walking around. ? Take the stairs instead of elevators or escalators.  Wear comfortable clothes and shoes with good support.  Do not exercise so much that you hurt yourself, feel dizzy, or get very short of breath. Where to find more information  U.S. Department of Health and Human Services: [employer]at  Centers for Disease Control and Prevention (CDC): http://www.wolf.info/ Contact a health care provider:  Before starting a new exercise program.  If you have questions or concerns about your weight.  If you have a medical problem that keeps you from exercising. Get help right away if you have any of the following while exercising:  Injury.  Dizziness.  Difficulty breathing or shortness of breath that does not go away when you stop exercising.  Chest pain.  Rapid heartbeat. Summary  Being overweight increases your risk of heart disease, stroke, diabetes, high blood pressure, and several types of cancer.  Losing weight happens when you burn more calories than you eat.  Reducing the amount of calories you eat in addition to getting regular moderate or vigorous exercise each week helps you lose weight. This information is not intended to replace advice given to you by your health care provider. Make sure you discuss any questions you have with your health care provider. Document Released: 01/05/2011 Document Revised: 12/16/2017 Document Reviewed: 12/16/2017 Elsevier Interactive Patient Education  2019 [employer].

## 2019-06-04 ENCOUNTER — Encounter: Payer: Self-pay | Admitting: Internal Medicine

## 2019-06-05 LAB — CBC
Hematocrit: 40.9 % (ref 34.0–46.6)
Hemoglobin: 13.3 g/dL (ref 11.1–15.9)
MCH: 28.9 pg (ref 26.6–33.0)
MCHC: 32.5 g/dL (ref 31.5–35.7)
MCV: 89 fL (ref 79–97)
Platelets: 345 10*3/uL (ref 150–450)
RBC: 4.6 x10E6/uL (ref 3.77–5.28)
RDW: 13.4 % (ref 11.7–15.4)
WBC: 6.3 10*3/uL (ref 3.4–10.8)

## 2019-06-05 LAB — MEASLES/MUMPS/RUBELLA IMMUNITY
MUMPS ABS, IGG: 231 AU/mL (ref >10.9)
RUBEOLA AB, IGG: >300 AU/mL (ref >16.4)
Rubella Antibodies, IGG: 5.45 index (ref >0.99)

## 2019-06-05 LAB — QUANTIFERON-TB GOLD PLUS
QuantiFERON Mitogen Value: >10 IU/mL
QuantiFERON Nil Value: 0.02 IU/mL
QuantiFERON TB1 Ag Value: 0.02 IU/mL
QuantiFERON TB2 Ag Value: 0.02 IU/mL
QuantiFERON-TB Gold Plus: NEGATIVE

## 2019-06-08 ENCOUNTER — Other Ambulatory Visit: Payer: Self-pay

## 2019-06-14 NOTE — Progress Notes (Addendum)
Subjective:     Patient ID: [name] , female    DOB: 21-Apr-1976 , 43 y.o.   MRN: [id number]   Chief Complaint  Patient presents with  . Quantiferon test    HPI  She is here today for TB-quantiferon testing. This is needed for school. 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 She has form with her to be completed as well.     Past Medical History:  Diagnosis Date  . Anxiety   . Constipation   . Constipation   . Joint pain   . Lactose intolerance   . Nervousness   . Obesity   . PCOS (polycystic ovarian syndrome)   . Sickle cell trait ([place])   . Vaginal delivery 1997, 2005, 2011  . Vitamin D deficiency      Family History  Problem Relation Age of Onset  . Hypertension Mother   . Alcoholism Mother   . Drug abuse Mother   . Hyperlipidemia Father   . Alcoholism Father   . Drug abuse Father   . Breast cancer Neg Hx      Current Outpatient Medications:  .  buPROPion (WELLBUTRIN SR) 150 MG 12 hr tablet, Take 1 tablet (150 mg total) by mouth 2 (two) times daily., Disp: 60 tablet, Rfl: 0 .  Insulin Pen Needle (BD PEN NEEDLE NANO 2ND GEN) 32G X 4 MM MISC, 1 Package by Does not apply route 2 (two) times daily., Disp: 100 each, Rfl: 0 .  Liraglutide -Weight Management (SAXENDA) 18 MG/3ML SOPN, Inject 3 mg into the skin daily., Disp: 5 pen, Rfl: 0 .  Vitamin D, Ergocalciferol, (DRISDOL) 1.25 MG (50000 UT) CAPS capsule, Take 1 capsule (50,000 Units total) by mouth every 7 (seven) days., Disp: 4 capsule, Rfl: 0   No Known Allergies   Review of Systems  Constitutional: Negative.   Respiratory: Negative.   Cardiovascular: Negative.   Gastrointestinal: Negative.   Neurological: Negative.   Psychiatric/Behavioral: Negative.      Today's Vitals   06/03/19 1419  BP: 132/86  Pulse: (!) 105  Temp: 98.9 F (37.2 C)  TempSrc: Oral  Weight: 242 lb 6.4 oz (110 kg)  Height: 5\' 7"  (1.702 m)   Body mass index is 37.97 kg/m.   Objective:  Physical Exam Vitals signs and  nursing note reviewed.  Constitutional:      Appearance: Normal appearance.  HENT:     Head: Normocephalic and atraumatic.  Cardiovascular:     Rate and Rhythm: Normal rate and regular rhythm.     Heart sounds: Normal heart sounds.  Pulmonary:     Effort: Pulmonary effort is normal.     Breath sounds: Normal breath sounds.  Skin:    General: Skin is warm.  Neurological:     General: No focal deficit present.     Mental Status: She is alert.  Psychiatric:        Mood and Affect: Mood normal.        Behavior: Behavior normal.         Assessment And Plan:     1. Elevated blood pressure reading  She is aware of current bp reading. She is encouraged to incorporate more exercise into her daily routine. She is also encouraged to avoid adding salt to her foods.  She agrees to rto in three to four months for re-evaluation.   2. Weight gain  She is aware of current weight. We discussed various ways she can introduce more exercise  into her daily routine.   3. Class 2 obesity due to excess calories without serious comorbidity with body mass index (BMI) of 37.0 to 37.9 in adult  Importance of achieving optimal weight to decrease risk of cardiovascular disease and cancers was discussed with the patient in full detail. She is encouraged to start slowly - start with 10 minutes twice daily at least three to four days per week and to gradually build to 30 minutes five days weekly. She was given tips to incorporate more activity into her daily routine - take stairs when possible, park farther away from her job, grocery stores, etc.    4. Encounter for school history and physical examination  I reviewed the form with patient. Hearing/vision exam performed as well. No abnormalities were noted. Pt advised she will be contacted when form is completed and ready for pickup.   - CBC no Diff - QuantiFERON-TB Gold Plus - Measles/Mumps/Rubella Immunity  Maximino Greenland, MD    THE PATIENT IS  ENCOURAGED TO PRACTICE SOCIAL DISTANCING DUE TO THE COVID-19 PANDEMIC.

## 2019-06-15 ENCOUNTER — Encounter: Payer: Self-pay | Admitting: Internal Medicine

## 2019-06-15 NOTE — Progress Notes (Signed)
Office: [phone]  /  Fax: [phone]    Date: June 17, 2019   Appointment Start Time: 8:33am Duration: 30 minutes Provider: Glennie Isle, Psy.D. Type of Session: Individual Therapy  Location of Patient: Home Location of Provider: Provider's Home Type of Contact: Telepsychological Visit via [employer] WebEx   Session Content: [name] is a 43 y.o. female presenting via [place] for a follow-up appointment to address the previously established treatment goal of decreasing emotional eating. Today's appointment was a telepsychological visit, as this provider's clinic is seeing a limited number of patients for in-person visits due to COVID-19. Therapeutic services will resume to in-person appointments once deemed appropriate. [name] expressed understanding regarding the rationale for telepsychological services, and provided verbal consent for today's appointment. Prior to proceeding with today's appointment, [name]'s physical location at the time of this appointment was obtained. Jaselynn reported she was at home and provided the address. In the event of technical difficulties, [name] shared a phone number she could be reached at. [name] and this provider participated in today's telepsychological service. Also, [name] denied anyone else being present in the room or on the WebEx appointment.  This provider conducted a brief check-in and verbally administered the PHQ-9 and GAD-7. [name] stated, "It's been a good 3 weeks." She further shared speaking with her PCP during a physical exam and "advice" was provided. More specifically, she discussed that per the advice, she now pairs her "treats" with walking while listening to a podcast on Black history, which was recommended by her PCP. [name] reported walking has increased her energy and it has contributed to hope regarding weight loss. She also discussed an increase in protein intake and she described making better choices when eating. Moreover, she is now journaling  to keep track of what she is eating, and she discussed being able to frequently meet her calorie and protein goals recommended by [name]. This was positively reinforced, and her progress to date was reflected, including an increase in her confidence. Regarding emotional eating, [name] shared a decrease in emotional eating and she was observed experiencing difficulty recalling any episodes of emotional eating since the last appointment with this provider. She reflected further on characteristics of physical versus emotional hunger and how that has helped. Due to continued progress, termination planning was further discussed and a termination appointment will be scheduled in four weeks. Regarding mindfulness, [name] stated, "I haven't tried it." This was discussed further and psychoeducation regarding formal versus informal mindfulness practice was provided. [name] was receptive to engaging in a mindfulness walk between now and the next appointment. Overall, [name] was receptive to today's session as evidenced by openness to sharing, responsiveness to feedback, and willingness to engage in mindfulness.  Mental Status Examination:  Appearance: neat Behavior: cooperative Mood: euthymic Affect: mood congruent Speech: normal in rate, volume, and tone Eye Contact: appropriate Psychomotor Activity: appropriate Thought Process: linear, logical, and goal directed  Content/Perceptual Disturbances: denies suicidal and homicidal ideation, plan, and intent and no hallucinations, delusions, bizarre thinking or behavior reported or observed Orientation: time, person, place and purpose of appointment Cognition/Sensorium: memory, attention, language, and fund of knowledge intact  Insight: good Judgment: good  Structured Assessment Results: The Patient Health Questionnaire-9 (PHQ-9) is a self-report measure that assesses symptoms and severity of depression over the course of the last two weeks. Yamilet obtained a score  of 1 suggesting minimal depression. [name] finds the endorsed symptoms to be somewhat difficult. [name] interest or pleasure in doing things 0  Feeling down, depressed,  or hopeless 0  Trouble falling or staying asleep, or sleeping too much 0  Feeling tired or having [name] energy 0  Poor appetite or overeating 0  Feeling bad about yourself --- or that you are a failure or have let yourself or your family down 0  Trouble concentrating on things, such as reading the newspaper or watching television 1  Moving or speaking so slowly that other people could have noticed? Or the opposite --- being so fidgety or restless that you have been moving around a lot more than usual 0  Thoughts that you would be better off dead or hurting yourself in some way 0  PHQ-9 Score 1    The Generalized Anxiety Disorder-7 (GAD-7) is a brief self-report measure that assesses symptoms of anxiety over the course of the last two weeks. [name] obtained a score of 2 suggesting minimal anxiety. Glorine finds the endorsed symptoms to be not difficult at all. Feeling nervous, anxious, on edge 1  Not being able to stop or control worrying 0  Worrying too much about different things 0  Trouble relaxing 0  Being so restless that it's hard to sit still 1  Becoming easily annoyed or irritable 0  Feeling afraid as if something awful might happen 0  GAD-7 Score 2   Interventions:  Conducted a brief chart review Verbal administration of PHQ-9 and GAD-7 for symptom monitoring Provided empathic reflections and validation Reviewed content from the previous session Discussed termination planning Provided positive reinforcement Employed supportive psychotherapy interventions to facilitate reduced distress, and to improve coping skills with identified stressors Psychoeducation provided regarding formal versus informal mindfulness practice  DSM-5 Diagnosis: 300.02 (F41.1) Generalized Anxiety Disorder  Treatment Goal & Progress: During  the initial appointment with this provider, the following treatment goal was established: decrease emotional eating. Tmya has demonstrated progress in her goal as evidenced by increased awareness in hunger patterns and triggers for emotional eating. Since the onset of treatment, Jeaneane reported a reduction in emotional eating and she continues to demonstrate willingness to engage in learned skills.   Plan: Katiana continues to appear able and willing to participate as evidenced by engagement in reciprocal conversation, and asking questions for clarification as appropriate. The next appointment will be scheduled in one month, which will be via [employer]. Once this provider's office resumes in-person appointments and it is deemed appropriate, Nuri will be notified. The next session will focus further on mindfulness and termination.

## 2019-06-17 ENCOUNTER — Other Ambulatory Visit: Payer: Self-pay

## 2019-06-17 ENCOUNTER — Telehealth: Payer: Self-pay

## 2019-06-17 ENCOUNTER — Ambulatory Visit (INDEPENDENT_AMBULATORY_CARE_PROVIDER_SITE_OTHER): Payer: 59 | Admitting: Psychology

## 2019-06-17 DIAGNOSIS — F411 Generalized anxiety disorder: Secondary | ICD-10-CM

## 2019-06-17 NOTE — Telephone Encounter (Signed)
The patient was notified that her physical examination from is ready for pickup along with her lab work for titers.

## 2019-07-14 NOTE — Progress Notes (Unsigned)
  Office: [phone]  /  Fax: [phone]    Date: July 15, 2019   Appointment Start Time:*** Duration:*** Provider: Glennie Isle, Psy.D. Type of Session: Individual Therapy  Location of Patient: *** Location of Provider: Provider's Home Type of Contact: Telepsychological Visit via [employer] WebEx   Session Content: [name] is a 43 y.o. female presenting via [place] for a follow-up appointment to address the previously established treatment goal of decreasing emotional eating. Today's appointment was a telepsychological visit, as this provider's clinic is seeing a limited number of patients for in-person visits due to COVID-19. Therapeutic services will resume to in-person appointments once deemed appropriate. [name] expressed understanding regarding the rationale for telepsychological services, and provided verbal consent for today's appointment. Prior to proceeding with today's appointment, [name]'s physical location at the time of this appointment was obtained. [name] reported she was at *** and provided the address. In the event of technical difficulties, [name] shared a phone number she could be reached at. [name] and this provider participated in today's telepsychological service. [name], [name] denied anyone else being present in the room or on the WebEx appointment ***.  This provider conducted a brief check-in and verbally administered the PHQ-9 and GAD-7. ***   [name] was receptive to today's session as evidenced by openness to sharing, responsiveness to feedback, and ***.  Mental Status Examination:  Appearance: {Appearance:22431} Behavior: {Behavior:22445} Mood: {Teletherapy mood:22435} Affect: {Affect:22436} Speech: {Speech:22432} Eye Contact: {Eye Contact:22433} Psychomotor Activity: {Motor Activity:22434} Thought Process: {thought process:22448}  Content/Perceptual Disturbances: {disturbances:22451} Orientation: {Orientation:22437} Cognition/Sensorium: {gbcognition:22449}  Insight: {Insight:22446} Judgment: {Insight:22446}  Structured Assessment Results: The Patient Health Questionnaire-9 (PHQ-9) is a self-report measure that assesses symptoms and severity of depression over the course of the last two weeks. [name] obtained a score of *** suggesting {GBPHQ9SEVERITY:21752}. [name] finds the endorsed symptoms to be {gbphq9difficulty:21754}. [name] interest or pleasure in doing things ***  Feeling down, depressed, or hopeless ***  Trouble falling or staying asleep, or sleeping too much ***  Feeling tired or having [name] energy ***  Poor appetite or overeating ***  Feeling bad about yourself --- or that you are a failure or have let yourself or your family down ***  Trouble concentrating on things, such as reading the newspaper or watching television ***  Moving or speaking so slowly that other people could have noticed? Or the opposite --- being so fidgety or restless that you have been moving around a lot more than usual ***  Thoughts that you would be better off dead or hurting yourself in some way ***  PHQ-9 Score ***    The Generalized Anxiety Disorder-7 (GAD-7) is a brief self-report measure that assesses symptoms of anxiety over the course of the last two weeks. [name] obtained a score of *** suggesting {gbgad7severity:21753}. [name] finds the endorsed symptoms to be {gbphq9difficulty:21754}. Feeling nervous, anxious, on edge ***  Not being able to stop or control worrying ***  Worrying too much about different things ***  Trouble relaxing ***  Being so restless that it's hard to sit still ***  Becoming easily annoyed or irritable ***  Feeling afraid as if something awful might happen ***  GAD-7 Score ***   Interventions:  {Interventions:22172}  DSM-5 Diagnosis: 300.02 (F41.1) Generalized Anxiety Disorder  Treatment Goal & Progress: During the initial appointment with this provider, the following treatment goal was established: decrease emotional eating.  [name] has demonstrated progress in her goal as evidenced by ***  Plan:

## 2019-07-15 ENCOUNTER — Ambulatory Visit (INDEPENDENT_AMBULATORY_CARE_PROVIDER_SITE_OTHER): Payer: Self-pay | Admitting: Psychology

## 2019-07-15 ENCOUNTER — Telehealth (INDEPENDENT_AMBULATORY_CARE_PROVIDER_SITE_OTHER): Payer: Self-pay | Admitting: Psychology

## 2019-07-15 NOTE — Telephone Encounter (Signed)
  Office: [phone]  /  Fax: [phone]  Date of Call: July 15, 2019  Time of Call: 8:32am Provider: Glennie Isle, PsyD  CONTENT: This provider called Krystyn to check-in as she did not present for today's Webex appointment at 8:30am. A HIPAA compliant voicemail was left requesting a call back. Of note, this provider stayed on the [facility] appointment for 7 minutes prior to signing off, which is 2 additional minutes past the clinic's 5 minute grace period policy.    PLAN: This provider will wait for Veida to call back. If deemed necessary, this provider or the provider's clinic will call Sophira again in approximately one week.

## 2019-07-23 ENCOUNTER — Telehealth (INDEPENDENT_AMBULATORY_CARE_PROVIDER_SITE_OTHER): Payer: Self-pay | Admitting: Psychology

## 2019-07-23 NOTE — Telephone Encounter (Signed)
  Office: [phone]  /  Fax: [phone]  Date of Call: July 23, 2019  Time of Call: 2:15pm Provider: Glennie Isle, PsyD  CONTENT: This provider called Rashmi to check-in and schedule a follow-up appointment. A HIPAA compliant voicemail was left requesting a call back.   PLAN: This provider will wait for Mammie to call back. If deemed necessary, this provider or the provider's clinic will call Janel again in approximately one week.

## 2019-07-30 ENCOUNTER — Encounter (INDEPENDENT_AMBULATORY_CARE_PROVIDER_SITE_OTHER): Payer: Self-pay

## 2019-10-22 ENCOUNTER — Encounter: Payer: 59 | Admitting: Internal Medicine

## 2019-11-24 ENCOUNTER — Ambulatory Visit (INDEPENDENT_AMBULATORY_CARE_PROVIDER_SITE_OTHER): Payer: 59 | Admitting: Internal Medicine

## 2019-11-24 ENCOUNTER — Other Ambulatory Visit: Payer: Self-pay

## 2019-11-24 ENCOUNTER — Encounter: Payer: Self-pay | Admitting: Internal Medicine

## 2019-11-24 VITALS — BP 122/84 | HR 87 | Temp 98.4°F | Ht 66.8 in | Wt 252.8 lb

## 2019-11-24 DIAGNOSIS — Z0000 Encounter for general adult medical examination without abnormal findings: Secondary | ICD-10-CM | POA: Diagnosis not present

## 2019-11-24 DIAGNOSIS — R635 Abnormal weight gain: Secondary | ICD-10-CM

## 2019-11-24 DIAGNOSIS — Z6839 Body mass index (BMI) 39.0-39.9, adult: Secondary | ICD-10-CM | POA: Diagnosis not present

## 2019-11-24 DIAGNOSIS — E6609 Other obesity due to excess calories: Secondary | ICD-10-CM

## 2019-11-24 LAB — POCT URINALYSIS DIPSTICK
Bilirubin, UA: NEGATIVE
Glucose, UA: NEGATIVE
Ketones, UA: NEGATIVE
Leukocytes, UA: NEGATIVE
Nitrite, UA: NEGATIVE
Protein, UA: NEGATIVE
Spec Grav, UA: 1.02 (ref 1.010–1.025)
Urobilinogen, UA: 0.2 E.U./dL
pH, UA: 7 (ref 5.0–8.0)

## 2019-11-24 MED ORDER — CONTRAVE 8-90 MG PO TB12: ORAL_TABLET | ORAL | 0 refills | Status: DC

## 2019-11-24 NOTE — Patient Instructions (Addendum)
Oscillococcinum - homeopathic remedy Bone broth Vitamin D 5000 units daily Zicam gummies - take when you have a cold   Health Maintenance, Female Adopting a healthy lifestyle and getting preventive care are important in promoting health and wellness. Ask your health care provider about:  The right schedule for you to have regular tests and exams.  Things you can do on your own to prevent diseases and keep yourself healthy. What should I know about diet, weight, and exercise? Eat a healthy diet   Eat a diet that includes plenty of vegetables, fruits, low-fat dairy products, and lean protein.  Do not eat a lot of foods that are high in solid fats, added sugars, or sodium. Maintain a healthy weight Body mass index (BMI) is used to identify weight problems. It estimates body fat based on height and weight. Your health care provider can help determine your BMI and help you achieve or maintain a healthy weight. Get regular exercise Get regular exercise. This is one of the most important things you can do for your health. Most adults should:  Exercise for at least 150 minutes each week. The exercise should increase your heart rate and make you sweat (moderate-intensity exercise).  Do strengthening exercises at least twice a week. This is in addition to the moderate-intensity exercise.  Spend less time sitting. Even light physical activity can be beneficial. Watch cholesterol and blood lipids Have your blood tested for lipids and cholesterol at 41 years of age, then have this test every 5 years. Have your cholesterol levels checked more often if:  Your lipid or cholesterol levels are high.  You are older than 41 years of age.  You are at high risk for heart disease. What should I know about cancer screening? Depending on your health history and family history, you may need to have cancer screening at various ages. This may include screening for:  Breast cancer.  Cervical cancer.   Colorectal cancer.  Skin cancer.  Lung cancer. What should I know about heart disease, diabetes, and high blood pressure? Blood pressure and heart disease  High blood pressure causes heart disease and increases the risk of stroke. This is more likely to develop in people who have high blood pressure readings, are of African descent, or are overweight.  Have your blood pressure checked: ? Every 3-5 years if you are 41-41 years of age. ? Every year if you are 41 years old or older. Diabetes Have regular diabetes screenings. This checks your fasting blood sugar level. Have the screening done:  Once every three years after age 19 if you are at a normal weight and have a low risk for diabetes.  More often and at a younger age if you are overweight or have a high risk for diabetes. What should I know about preventing infection? Hepatitis B If you have a higher risk for hepatitis B, you should be screened for this virus. Talk with your health care provider to find out if you are at risk for hepatitis B infection. Hepatitis C Testing is recommended for:  Everyone born from [birth year] through 1963.  Anyone with known risk factors for hepatitis C. Sexually transmitted infections (STIs)  Get screened for STIs, including gonorrhea and chlamydia, if: ? You are sexually active and are younger than 41 years of age. ? You are older than 41 years of age and your health care provider tells you that you are at risk for this type of infection. ? Your sexual activity has  changed since you were last screened, and you are at increased risk for chlamydia or gonorrhea. Ask your health care provider if you are at risk.  Ask your health care provider about whether you are at high risk for HIV. Your health care provider may recommend a prescription medicine to help prevent HIV infection. If you choose to take medicine to prevent HIV, you should first get tested for HIV. You should then be tested every 3 months for  as long as you are taking the medicine. Pregnancy  If you are about to stop having your period (premenopausal) and you may become pregnant, seek counseling before you get pregnant.  Take 400 to 800 micrograms (mcg) of folic acid every day if you become pregnant.  Ask for birth control (contraception) if you want to prevent pregnancy. Osteoporosis and menopause Osteoporosis is a disease in which the bones lose minerals and strength with aging. This can result in bone fractures. If you are 24 years old or older, or if you are at risk for osteoporosis and fractures, ask your health care provider if you should:  Be screened for bone loss.  Take a calcium or vitamin D supplement to lower your risk of fractures.  Be given hormone replacement therapy (HRT) to treat symptoms of menopause. Follow these instructions at home: Lifestyle  Do not use any products that contain nicotine or tobacco, such as cigarettes, e-cigarettes, and chewing tobacco. If you need help quitting, ask your health care provider.  Do not use street drugs.  Do not share needles.  Ask your health care provider for help if you need support or information about quitting drugs. Alcohol use  Do not drink alcohol if: ? Your health care provider tells you not to drink. ? You are pregnant, may be pregnant, or are planning to become pregnant.  If you drink alcohol: ? Limit how much you use to 0-1 drink a day. ? Limit intake if you are breastfeeding.  Be aware of how much alcohol is in your drink. In the [place] one drink equals one 12 oz bottle of beer (355 mL), one 5 oz glass of wine (148 mL), or one 1 oz glass of hard liquor (44 mL). General instructions  Schedule regular health, dental, and eye exams.  Stay current with your vaccines.  Tell your health care provider if: ? You often feel depressed. ? You have ever been abused or do not feel safe at home. Summary  Adopting a healthy lifestyle and getting preventive  care are important in promoting health and wellness.  Follow your health care provider's instructions about healthy diet, exercising, and getting tested or screened for diseases.  Follow your health care provider's instructions on monitoring your cholesterol and blood pressure. This information is not intended to replace advice given to you by your health care provider. Make sure you discuss any questions you have with your health care provider. Document Released: 06/18/2011 Document Revised: 11/26/2018 Document Reviewed: 11/26/2018 Elsevier Patient Education  2020 [employer].

## 2019-11-24 NOTE — Progress Notes (Signed)
This visit occurred during the SARS-CoV-2 public health emergency.  Safety protocols were in place, including screening questions prior to the visit, additional usage of staff PPE, and extensive cleaning of exam room while observing appropriate contact time as indicated for disinfecting solutions.  Subjective:     Patient ID: [name] , female    DOB: 01-23-1976 , 43 y.o.   MRN: [id number]   Chief Complaint  Patient presents with  . Annual Exam    HPI  She is here today for a full physical examination. She is followed by Dr. Mancel Bale for her GYN care. She is s/p hysterectomy.     Past Medical History:  Diagnosis Date  . Anxiety   . Constipation   . Constipation   . Joint pain   . Lactose intolerance   . Nervousness   . Obesity   . PCOS (polycystic ovarian syndrome)   . Sickle cell trait ([place])   . Vaginal delivery 1997, 2005, 2011  . Vitamin D deficiency      Family History  Problem Relation Age of Onset  . Hypertension Mother   . Alcoholism Mother   . Drug abuse Mother   . Hyperlipidemia Father   . Alcoholism Father   . Drug abuse Father   . Breast cancer Neg Hx      Current Outpatient Medications:  .  buPROPion (WELLBUTRIN SR) 150 MG 12 hr tablet, Take 1 tablet (150 mg total) by mouth 2 (two) times daily. (Patient not taking: Reported on 11/24/2019), Disp: 60 tablet, Rfl: 0 .  Insulin Pen Needle (BD PEN NEEDLE NANO 2ND GEN) 32G X 4 MM MISC, 1 Package by Does not apply route 2 (two) times daily. (Patient not taking: Reported on 11/24/2019), Disp: 100 each, Rfl: 0 .  Liraglutide -Weight Management (SAXENDA) 18 MG/3ML SOPN, Inject 3 mg into the skin daily. (Patient not taking: Reported on 11/24/2019), Disp: 5 pen, Rfl: 0 .  Vitamin D, Ergocalciferol, (DRISDOL) 1.25 MG (50000 UT) CAPS capsule, Take 1 capsule (50,000 Units total) by mouth every 7 (seven) days. (Patient not taking: Reported on 11/24/2019), Disp: 4 capsule, Rfl: 0   No Known Allergies    The patient  states she uses status post hysterectomy for birth control. Last LMP was No LMP recorded. Patient has had a hysterectomy.. Negative for Dysmenorrhea  Negative for: breast discharge, breast lump(s), breast pain and breast self exam. Associated symptoms include abnormal vaginal bleeding. Pertinent negatives include abnormal bleeding (hematology), anxiety, decreased libido, depression, difficulty falling sleep, dyspareunia, history of infertility, nocturia, sexual dysfunction, sleep disturbances, urinary incontinence, urinary urgency, vaginal discharge and vaginal itching. Diet regular.The patient states her exercise level is  intermittent.   . The patient's tobacco use is:  Social History   Tobacco Use  Smoking Status Never Smoker  Smokeless Tobacco Never Used  . She has been exposed to passive smoke. The patient's alcohol use is:  Social History   Substance and Sexual Activity  Alcohol Use Yes   Comment: wine    Review of Systems  Constitutional: Negative.   HENT: Negative.   Eyes: Negative.   Respiratory: Negative.   Cardiovascular: Negative.   Endocrine: Negative.   Genitourinary: Negative.   Musculoskeletal: Negative.   Skin: Negative.   Allergic/Immunologic: Negative.   Neurological: Negative.   Hematological: Negative.   Psychiatric/Behavioral: Negative.      Today's Vitals   11/24/19 0932  BP: 122/84  Pulse: 87  Temp: 98.4 F (36.9 C)  TempSrc: Oral  Weight: 252 lb 12.8 oz (114.7 kg)  Height: 5' 6.8" (1.697 m)   Body mass index is 39.83 kg/m.   Objective:  Physical Exam Vitals signs and nursing note reviewed.  Constitutional:      Appearance: Normal appearance. She is obese.  HENT:     Head: Normocephalic and atraumatic.     Right Ear: Tympanic membrane, ear canal and external ear normal.     Left Ear: Tympanic membrane, ear canal and external ear normal.     Nose:     Comments: Deferred, masked    Mouth/Throat:     Comments: Deferred, masked Eyes:      Extraocular Movements: Extraocular movements intact.     Conjunctiva/sclera: Conjunctivae normal.     Pupils: Pupils are equal, round, and reactive to light.  Neck:     Musculoskeletal: Normal range of motion and neck supple.  Cardiovascular:     Rate and Rhythm: Normal rate and regular rhythm.     Pulses: Normal pulses.     Heart sounds: Normal heart sounds.  Pulmonary:     Effort: Pulmonary effort is normal.     Breath sounds: Normal breath sounds.  Chest:     Breasts: Tanner Score is 5.        Right: Normal.        Left: Normal.  Abdominal:     General: Bowel sounds are normal.     Palpations: Abdomen is soft.     Comments: Rounded, soft.   Genitourinary:    Comments: deferred Musculoskeletal: Normal range of motion.  Skin:    General: Skin is warm and dry.  Neurological:     General: No focal deficit present.     Mental Status: She is alert and oriented to person, place, and time.  Psychiatric:        Mood and Affect: Mood normal.        Behavior: Behavior normal.         Assessment And Plan:     1. Routine general medical examination at health care facility  A full exam was performed.  Importance of monthly self breast exams was discussed with the patient. PATIENT HAS BEEN ADVISED TO GET 30-45 MINUTES REGULAR EXERCISE NO LESS THAN FOUR TO FIVE DAYS PER WEEK - BOTH WEIGHTBEARING EXERCISES AND AEROBIC ARE RECOMMENDED.  SHE WAS ADVISED TO FOLLOW A HEALTHY DIET WITH AT LEAST SIX FRUITS/VEGGIES PER DAY, DECREASE INTAKE OF RED MEAT, AND TO INCREASE FISH INTAKE TO TWO DAYS PER WEEK.  MEATS/FISH SHOULD NOT BE FRIED, BAKED OR BROILED IS PREFERABLE.  I SUGGEST WEARING SPF 50 SUNSCREEN ON EXPOSED PARTS AND ESPECIALLY WHEN IN THE DIRECT SUNLIGHT FOR AN EXTENDED PERIOD OF TIME.  PLEASE AVOID FAST FOOD RESTAURANTS AND INCREASE YOUR WATER INTAKE.  - CMP14+EGFR - CBC - Lipid panel - Hemoglobin A1c - TSH - POCT Urinalysis Dipstick (81002)  2. Weight gain  She is encouraged to  aim for 150 minutes of exercise per week. She is also encouraged to avoid sugary beverages and processed foods.   3. Class 2 obesity due to excess calories without serious comorbidity with body mass index (BMI) of 39.0 to 39.9 in adult  We discussed pharmacologic agents to help with weight loss.  She would like to try Contrave, she has been on Wellbutrin in the past. Pt will start with one tab daily x 1 week, then one tab twice daily. Possible side effects were discussed with the patient. She is aware that  she may not take rx narcotics while on this medication. She will rto in six weeks for re-evaluation.    Maximino Greenland, MD    THE PATIENT IS ENCOURAGED TO PRACTICE SOCIAL DISTANCING DUE TO THE COVID-19 PANDEMIC.

## 2019-11-25 LAB — CBC
Hematocrit: 39.7 % (ref 34.0–46.6)
Hemoglobin: 12.9 g/dL (ref 11.1–15.9)
MCH: 29.2 pg (ref 26.6–33.0)
MCHC: 32.5 g/dL (ref 31.5–35.7)
MCV: 90 fL (ref 79–97)
Platelets: 318 10*3/uL (ref 150–450)
RBC: 4.42 x10E6/uL (ref 3.77–5.28)
RDW: 13.3 % (ref 11.7–15.4)
WBC: 5.8 10*3/uL (ref 3.4–10.8)

## 2019-11-25 LAB — LIPID PANEL
Chol/HDL Ratio: 3.8 ratio (ref 0.0–4.4)
Cholesterol, Total: 208 mg/dL — ABNORMAL HIGH (ref 100–199)
HDL: 55 mg/dL (ref >39)
LDL Chol Calc (NIH): 130 mg/dL — ABNORMAL HIGH (ref 0–99)
Triglycerides: 127 mg/dL (ref 0–149)
VLDL Cholesterol Cal: 23 mg/dL (ref 5–40)

## 2019-11-25 LAB — TSH: TSH: 0.645 u[IU]/mL (ref 0.450–4.500)

## 2019-11-25 LAB — CMP14+EGFR
ALT: 12 IU/L (ref 0–32)
AST: 13 IU/L (ref 0–40)
Albumin/Globulin Ratio: 1.9 (ref 1.2–2.2)
Albumin: 4.7 g/dL (ref 3.8–4.8)
Alkaline Phosphatase: 62 IU/L (ref 39–117)
BUN/Creatinine Ratio: 17 (ref 9–23)
BUN: 15 mg/dL (ref 6–24)
Bilirubin Total: 0.4 mg/dL (ref 0.0–1.2)
CO2: 22 mmol/L (ref 20–29)
Calcium: 9.5 mg/dL (ref 8.7–10.2)
Chloride: 107 mmol/L — ABNORMAL HIGH (ref 96–106)
Creatinine, Ser: 0.88 mg/dL (ref 0.57–1.00)
GFR calc Af Amer: 94 mL/min/{1.73_m2} (ref >59)
GFR calc non Af Amer: 82 mL/min/{1.73_m2} (ref >59)
Globulin, Total: 2.5 g/dL (ref 1.5–4.5)
Glucose: 101 mg/dL — ABNORMAL HIGH (ref 65–99)
Potassium: 4.2 mmol/L (ref 3.5–5.2)
Sodium: 143 mmol/L (ref 134–144)
Total Protein: 7.2 g/dL (ref 6.0–8.5)

## 2019-11-25 LAB — HEMOGLOBIN A1C
Est. average glucose Bld gHb Est-mCnc: 97 mg/dL
Hgb A1c MFr Bld: 5 % (ref 4.8–5.6)

## 2019-12-02 ENCOUNTER — Encounter: Payer: Self-pay | Admitting: Internal Medicine

## 2019-12-02 ENCOUNTER — Other Ambulatory Visit: Payer: Self-pay

## 2019-12-03 MED FILL — CICLOPIROX 1 % SHAM: 1 | 90 days supply | Qty: 120 | Fill #1

## 2019-12-03 MED FILL — CLOBETASOL PROP 0.05% OINT: 0.05 | 45 days supply | Qty: 45 | Fill #1

## 2019-12-07 ENCOUNTER — Encounter: Payer: Self-pay | Admitting: Internal Medicine

## 2019-12-09 MED FILL — CONTRAVE ER 8-90 MG TABLET: 8-90 | 27 days supply | Qty: 70 | Fill #0

## 2019-12-28 ENCOUNTER — Encounter: Payer: Self-pay | Admitting: Internal Medicine

## 2019-12-28 ENCOUNTER — Ambulatory Visit: Payer: 59 | Admitting: Internal Medicine

## 2019-12-28 ENCOUNTER — Other Ambulatory Visit: Payer: Self-pay

## 2019-12-28 VITALS — BP 124/80 | HR 90 | Temp 98.3°F | Ht 66.8 in | Wt 252.2 lb

## 2019-12-28 DIAGNOSIS — Z6839 Body mass index (BMI) 39.0-39.9, adult: Secondary | ICD-10-CM | POA: Diagnosis not present

## 2019-12-28 DIAGNOSIS — R41840 Attention and concentration deficit: Secondary | ICD-10-CM | POA: Diagnosis not present

## 2019-12-28 DIAGNOSIS — E6609 Other obesity due to excess calories: Secondary | ICD-10-CM

## 2019-12-28 MED ORDER — CONTRAVE 8-90 MG PO TB12: ORAL_TABLET | ORAL | 2 refills | Status: DC

## 2019-12-28 NOTE — Progress Notes (Signed)
This visit occurred during the SARS-CoV-2 public health emergency.  Safety protocols were in place, including screening questions prior to the visit, additional usage of staff PPE, and extensive cleaning of exam room while observing appropriate contact time as indicated for disinfecting solutions.  Subjective:     Patient ID: [name] , female    DOB: 05-20-1976 , 44 y.o.   MRN: [id number]   Chief Complaint  Patient presents with  . Obesity    HPI  She is here today for f/u obesity. She was started on Contrave at her last visit. She is up to one tablet twice daily. She has not experienced any side effects. She reports feeling well while on this medication. She has noticed that she has slightly improved focus, but does wish to pursue formal testing for ADD. She has noticed that she has less cravings as well.     Past Medical History:  Diagnosis Date  . Anxiety   . Constipation   . Constipation   . Joint pain   . Lactose intolerance   . Nervousness   . Obesity   . PCOS (polycystic ovarian syndrome)   . Sickle cell trait ([place])   . Vaginal delivery 1997, 2005, 2011  . Vitamin D deficiency      Family History  Problem Relation Age of Onset  . Hypertension Mother   . Alcoholism Mother   . Drug abuse Mother   . Hyperlipidemia Father   . Alcoholism Father   . Drug abuse Father   . Breast cancer Neg Hx      Current Outpatient Medications:  .  Cholecalciferol (VITAMIN D3) 250 MCG (10000 UT) TABS, Take 5 capsules by mouth once a week., Disp: , Rfl:  .  Naltrexone-buPROPion HCl ER (CONTRAVE) 8-90 MG TB12, Start 1 tablet every morning for 7 days, then 1 tablet twice daily for 7 days, then 2 tablets every morning and one every evening, Disp: 120 tablet, Rfl: 0   No Known Allergies   Review of Systems  Constitutional: Negative.   Respiratory: Negative.   Cardiovascular: Negative.   Gastrointestinal: Negative.   Neurological: Negative.   Psychiatric/Behavioral: Negative.       Today's Vitals   12/28/19 1159  BP: 124/80  Pulse: 90  Temp: 98.3 F (36.8 C)  TempSrc: Oral  Weight: 252 lb 3.2 oz (114.4 kg)  Height: 5' 6.8" (1.697 m)   Body mass index is 39.74 kg/m.   Objective:  Physical Exam Vitals and nursing note reviewed.  Constitutional:      Appearance: Normal appearance. She is obese.  HENT:     Head: Normocephalic and atraumatic.  Cardiovascular:     Rate and Rhythm: Normal rate and regular rhythm.     Heart sounds: Normal heart sounds.  Pulmonary:     Effort: Pulmonary effort is normal.     Breath sounds: Normal breath sounds.  Skin:    General: Skin is warm.  Neurological:     General: No focal deficit present.     Mental Status: She is alert.  Psychiatric:        Mood and Affect: Mood normal.        Behavior: Behavior normal.         Assessment And Plan:     1. Class 2 obesity due to excess calories without serious comorbidity with body mass index (BMI) of 39.0 to 39.9 in adult  She will increase her Contrave dose to one tab po QAM and  2 tabs po QPM. After two weeks, she will increase her dose to 2 tabs po twice daily. She is reminded that she may experience nausea with increased dose. She will rto in 8 weeks for re-evaluation.   2. Inattention  I will refer her to Dr. Rachel Moulds for formal ADD testing. She is in agreement with this referral.   - Ambulatory referral to Internal Medicine   Maximino Greenland, MD    THE PATIENT IS ENCOURAGED TO PRACTICE SOCIAL DISTANCING DUE TO THE COVID-19 PANDEMIC.

## 2019-12-28 NOTE — Patient Instructions (Signed)
Increase Contrave to one tablet in the morning and two tablets in the evening x 2 weeks  Then 2 tabs in the morning and 2 in the evening

## 2020-01-13 MED FILL — CONTRAVE ER 8-90 MG TABLET: 8-90 | 30 days supply | Qty: 120 | Fill #0

## 2020-02-08 ENCOUNTER — Encounter: Payer: Self-pay | Admitting: Internal Medicine

## 2020-02-09 ENCOUNTER — Ambulatory Visit: Payer: 59 | Admitting: Nurse Practitioner

## 2020-02-09 ENCOUNTER — Other Ambulatory Visit: Payer: Self-pay

## 2020-02-09 ENCOUNTER — Encounter: Payer: Self-pay | Admitting: Nurse Practitioner

## 2020-02-09 VITALS — BP 122/86 | HR 90 | Temp 98.6°F | Ht 67.4 in | Wt 248.8 lb

## 2020-02-09 DIAGNOSIS — F439 Reaction to severe stress, unspecified: Secondary | ICD-10-CM

## 2020-02-09 NOTE — Progress Notes (Signed)
This visit occurred during the SARS-CoV-2 public health emergency.  Safety protocols were in place, including screening questions prior to the visit, additional usage of staff PPE, and extensive cleaning of exam room while observing appropriate contact time as indicated for disinfecting solutions.  Subjective:     Patient ID: [name] , female    DOB: [birth year] , 44 y.o.   MRN: [id number]   Chief Complaint  Patient presents with  . Depression    patient would like to be taking out of work on medical leave she does not want to fall into a deep depression    HPI  She would like to be written out of work.  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  She feels at times she will take on their issues. She is also in school for NP, with a husband and 4 children.   She is taking contrave - which has the wellbutrin that helps her with anxiety.  She saw a spike in her weight when she became anxious.    Depression        This is a chronic problem.  The onset quality is sudden.   The problem has been gradually improving since onset.  Associated symptoms include no decreased concentration, no fatigue, no hopelessness and no headaches.     The symptoms are aggravated by nothing.  Past medical history includes anxiety.     Pertinent negatives include no dementia.  (lack of concentration, sleep disturbance)    Past Medical History:  Diagnosis Date  . Anxiety   . Constipation   . Constipation   . Joint pain   . Lactose intolerance   . Nervousness   . Obesity   . PCOS (polycystic ovarian syndrome)   . Sickle cell trait ([place])   . Vaginal delivery 1997, 2005, 2011  . Vitamin D deficiency      Family History  Problem Relation Age of Onset  . Hypertension Mother   . Alcoholism Mother   . Drug abuse Mother   . Hyperlipidemia Father   . Alcoholism Father   . Drug abuse Father   . Breast cancer Neg Hx      Current Outpatient Medications:  .  Cholecalciferol  (VITAMIN D3) 250 MCG (10000 UT) TABS, Take 5 capsules by mouth once a week., Disp: , Rfl:  .  Naltrexone-buPROPion HCl ER (CONTRAVE) 8-90 MG TB12, Start 2 tabs po twice daily, Disp: 120 tablet, Rfl: 2   No Known Allergies   Review of Systems  Constitutional: Negative for fatigue.  Neurological: Negative for headaches.  Psychiatric/Behavioral: Positive for depression. Negative for decreased concentration.     Today's Vitals   02/09/20 0842  BP: 122/86  Pulse: 90  Temp: 98.6 F (37 C)  TempSrc: Oral  Weight: 248 lb 12.8 oz (112.9 kg)  Height: 5' 7.4" (1.712 m)  PainSc: 0-No pain   Body mass index is 38.51 kg/m.   Objective:  Physical Exam Constitutional:      General: She is not in acute distress.    Appearance: Normal appearance. She is obese.  Skin:    Capillary Refill: Capillary refill takes less than 2 seconds.  Neurological:     General: No focal deficit present.     Mental Status: She is alert and oriented to person, place, and time.     Cranial Nerves: No cranial nerve deficit.  Psychiatric:        Mood and Affect: Mood normal.  Behavior: Behavior normal.        Thought Content: Thought content normal.        Judgment: Judgment normal.         Assessment And Plan:     1. Stress  She is in school for NP and working full-time   Will provide her a note to work 8 hours total this week and 10 hours next week  Encouraged to utilize outlets and take time for self when possible      Minette Brine, FNP    THE PATIENT IS ENCOURAGED TO PRACTICE SOCIAL DISTANCING DUE TO THE COVID-19 PANDEMIC.

## 2020-02-09 NOTE — Patient Instructions (Signed)
Stress, Adult Stress is a normal reaction to life events. Stress is what you feel when life demands more than you are used to, or more than you think you can handle. Some stress can be useful, such as studying for a test or meeting a deadline at work. Stress that occurs too often or for too long can cause problems. It can affect your emotional health and interfere with relationships and normal daily activities. Too much stress can weaken your body's defense system (immune system) and increase your risk for physical illness. If you already have a medical problem, stress can make it worse. What are the causes? All sorts of life events can cause stress. An event that causes stress for one person may not be stressful for another person. Major life events, whether positive or negative, commonly cause stress. Examples include:  Losing a job or starting a new job.  Losing a loved one.  Moving to a new town or home.  Getting married or divorced.  Having a baby.  Getting injured or sick. Less obvious life events can also cause stress, especially if they occur day after day or in combination with each other. Examples include:  Working long hours.  Driving in traffic.  Caring for children.  Being in debt.  Being in a difficult relationship. What are the signs or symptoms? Stress can cause emotional symptoms, including:  Anxiety. This is feeling worried, afraid, on edge, overwhelmed, or out of control.  Anger, including irritation or impatience.  Depression. This is feeling sad, down, helpless, or guilty.  Trouble focusing, remembering, or making decisions. Stress can cause physical symptoms, including:  Aches and pains. These may affect your head, neck, back, stomach, or other areas of your body.  Tight muscles or a clenched jaw.  Low energy.  Trouble sleeping. Stress can cause unhealthy behaviors, including:  Eating to feel better (overeating) or skipping meals.  Working too  much or putting off tasks.  Smoking, drinking alcohol, or using drugs to feel better. How is this diagnosed? Stress is diagnosed through an assessment by your health care provider. He or she may diagnose this condition based on:  Your symptoms and any stressful life events.  Your medical history.  Tests to rule out other causes of your symptoms. Depending on your condition, your health care provider may refer you to a specialist for further evaluation. How is this treated?  Stress management techniques are the recommended treatment for stress. Medicine is not typically recommended for the treatment of stress. Techniques to reduce your reaction to stressful life events include:  Stress identification. Monitor yourself for symptoms of stress and identify what causes stress for you. These skills may help you to avoid or prepare for stressful events.  Time management. Set your priorities, keep a calendar of events, and learn to say no. Taking these actions can help you avoid making too many commitments. Techniques for coping with stress include:  Rethinking the problem. Try to think realistically about stressful events rather than ignoring them or overreacting. Try to find the positives in a stressful situation rather than focusing on the negatives.  Exercise. Physical exercise can release both physical and emotional tension. The key is to find a form of exercise that you enjoy and do it regularly.  Relaxation techniques. These relax the body and mind. The key is to find one or more that you enjoy and use the techniques regularly. Examples include: ? Meditation, deep breathing, or progressive relaxation techniques. ? Yoga or   tai chi. ? Biofeedback, mindfulness techniques, or journaling. ? Listening to music, being out in nature, or participating in other hobbies.  Practicing a healthy lifestyle. Eat a balanced diet, drink plenty of water, limit or avoid caffeine, and get plenty of  sleep.  Having a strong support network. Spend time with family, friends, or other people you enjoy being around. Express your feelings and talk things over with someone you trust. Counseling or talk therapy with a mental health professional may be helpful if you are having trouble managing stress on your own. Follow these instructions at home: Lifestyle   Avoid drugs.  Do not use any products that contain nicotine or tobacco, such as cigarettes, e-cigarettes, and chewing tobacco. If you need help quitting, ask your health care provider.  Limit alcohol intake to no more than 1 drink a day for nonpregnant women and 2 drinks a day for men. One drink equals 12 oz of beer, 5 oz of wine, or 1 oz of hard liquor  Do not use alcohol or drugs to relax.  Eat a balanced diet that includes fresh fruits and vegetables, whole grains, lean meats, fish, eggs, and beans, and low-fat dairy. Avoid processed foods and foods high in added fat, sugar, and salt.  Exercise at least 30 minutes on 5 or more days each week.  Get 7-8 hours of sleep each night. General instructions   Practice stress management techniques as discussed with your health care provider.  Drink enough fluid to keep your urine clear or pale yellow.  Take over-the-counter and prescription medicines only as told by your health care provider.  Keep all follow-up visits as told by your health care provider. This is important. Contact a health care provider if:  Your symptoms get worse.  You have new symptoms.  You feel overwhelmed by your problems and can no longer manage them on your own. Get help right away if:  You have thoughts of hurting yourself or others. If you ever feel like you may hurt yourself or others, or have thoughts about taking your own life, get help right away. You can go to your nearest emergency department or call:  Your local emergency services (911 in the [place]  A suicide crisis helpline, such as the  [place] at (316) 250-6172. This is open 24 hours a day. Summary  Stress is a normal reaction to life events. It can cause problems if it happens too often or for too long.  Practicing stress management techniques is the best way to treat stress.  Counseling or talk therapy with a mental health professional may be helpful if you are having trouble managing stress on your own. This information is not intended to replace advice given to you by your health care provider. Make sure you discuss any questions you have with your health care provider. Document Revised: 07/03/2019 Document Reviewed: 01/23/2017 Elsevier Patient Education  [place].

## 2020-02-15 ENCOUNTER — Ambulatory Visit: Payer: 59 | Admitting: Internal Medicine

## 2020-02-15 ENCOUNTER — Other Ambulatory Visit: Payer: Self-pay

## 2020-02-15 ENCOUNTER — Encounter: Payer: Self-pay | Admitting: Internal Medicine

## 2020-02-15 VITALS — BP 124/82 | HR 90 | Temp 98.6°F | Ht 67.4 in | Wt 249.2 lb

## 2020-02-15 DIAGNOSIS — E6609 Other obesity due to excess calories: Secondary | ICD-10-CM | POA: Diagnosis not present

## 2020-02-15 DIAGNOSIS — Z6838 Body mass index (BMI) 38.0-38.9, adult: Secondary | ICD-10-CM | POA: Diagnosis not present

## 2020-02-15 NOTE — Progress Notes (Signed)
This visit occurred during the SARS-CoV-2 public health emergency.  Safety protocols were in place, including screening questions prior to the visit, additional usage of staff PPE, and extensive cleaning of exam room while observing appropriate contact time as indicated for disinfecting solutions.  Subjective:     Patient ID: [name] , female    DOB: 01-26-1976 , 44 y.o.   MRN: [id number]   Chief Complaint  Patient presents with  . Obesity    HPI  She is here today for f/u obesity.  She has been taking Contrave without any issues. She has been on highest dose - 2 tabs twice daily for almost two weeks. She admits she has yet to start a regular exercise program. She plans to start walking in the near future.     Past Medical History:  Diagnosis Date  . Anxiety   . Constipation   . Constipation   . Joint pain   . Lactose intolerance   . Nervousness   . Obesity   . PCOS (polycystic ovarian syndrome)   . Sickle cell trait ([place])   . Vaginal delivery 1997, 2005, 2011  . Vitamin D deficiency      Family History  Problem Relation Age of Onset  . Hypertension Mother   . Alcoholism Mother   . Drug abuse Mother   . Hyperlipidemia Father   . Drug abuse Father   . Alcoholism Father   . Breast cancer Neg Hx      Current Outpatient Medications:  .  Cholecalciferol (VITAMIN D3) 250 MCG (10000 UT) TABS, Take 5 capsules by mouth once a week., Disp: , Rfl:  .  Naltrexone-buPROPion HCl ER (CONTRAVE) 8-90 MG TB12, Start 2 tabs po twice daily, Disp: 120 tablet, Rfl: 2   No Known Allergies   Review of Systems  Constitutional: Negative.   Respiratory: Negative.   Cardiovascular: Negative.   Gastrointestinal: Negative.   Neurological: Negative.   Psychiatric/Behavioral: Negative.      Today's Vitals   02/15/20 1056  BP: 124/82  Pulse: 90  Temp: 98.6 F (37 C)  TempSrc: Oral  Weight: 249 lb 3.2 oz (113 kg)  Height: 5' 7.4" (1.712 m)   Body mass index is 38.57 kg/m.    Wt Readings from Last 3 Encounters:  02/15/20 249 lb 3.2 oz (113 kg)  02/09/20 248 lb 12.8 oz (112.9 kg)  12/28/19 252 lb 3.2 oz (114.4 kg)     Objective:  Physical Exam Vitals and nursing note reviewed.  Constitutional:      Appearance: Normal appearance.  HENT:     Head: Normocephalic and atraumatic.  Cardiovascular:     Rate and Rhythm: Normal rate and regular rhythm.     Heart sounds: Normal heart sounds.  Pulmonary:     Effort: Pulmonary effort is normal.     Breath sounds: Normal breath sounds.  Skin:    General: Skin is warm.  Neurological:     General: No focal deficit present.     Mental Status: She is alert.  Psychiatric:        Mood and Affect: Mood normal.        Behavior: Behavior normal.         Assessment And Plan:     1. Class 2 obesity due to excess calories without serious comorbidity with body mass index (BMI) of 38.0 to 38.9 in adult  She will continue with Contrave 2 tabs twice daily. She will rto in 8-10 weeks for  re-evaluation. She is advised to exercise at least 30 minutes five days per week. We also discussed various mechanisms for stress management.    Maximino Greenland, MD    THE PATIENT IS ENCOURAGED TO PRACTICE SOCIAL DISTANCING DUE TO THE COVID-19 PANDEMIC.

## 2020-02-18 ENCOUNTER — Telehealth: Payer: Self-pay

## 2020-02-18 NOTE — Telephone Encounter (Signed)
I called patient and notified her that her forms are ready for pick up and I have also faxed them as requested by pt. YRL,RMA

## 2020-03-03 ENCOUNTER — Ambulatory Visit: Payer: 59

## 2020-03-11 ENCOUNTER — Encounter: Payer: Self-pay | Admitting: Internal Medicine

## 2020-03-14 ENCOUNTER — Ambulatory Visit: Payer: 59 | Admitting: Internal Medicine

## 2020-03-18 MED FILL — CONTRAVE ER 8-90 MG TABLET: 8-90 | 30 days supply | Qty: 120 | Fill #1

## 2020-03-31 ENCOUNTER — Other Ambulatory Visit: Payer: Self-pay

## 2020-03-31 ENCOUNTER — Encounter: Payer: Self-pay | Admitting: Internal Medicine

## 2020-03-31 ENCOUNTER — Ambulatory Visit: Payer: 59 | Admitting: Internal Medicine

## 2020-03-31 VITALS — BP 118/72 | HR 77 | Temp 98.1°F | Ht 66.4 in | Wt 249.4 lb

## 2020-03-31 DIAGNOSIS — T63461A Toxic effect of venom of wasps, accidental (unintentional), initial encounter: Secondary | ICD-10-CM | POA: Diagnosis not present

## 2020-03-31 DIAGNOSIS — Z638 Other specified problems related to primary support group: Secondary | ICD-10-CM

## 2020-03-31 DIAGNOSIS — Z630 Problems in relationship with spouse or partner: Secondary | ICD-10-CM | POA: Diagnosis not present

## 2020-03-31 DIAGNOSIS — Z6839 Body mass index (BMI) 39.0-39.9, adult: Secondary | ICD-10-CM

## 2020-03-31 DIAGNOSIS — E6609 Other obesity due to excess calories: Secondary | ICD-10-CM | POA: Diagnosis not present

## 2020-03-31 MED ORDER — TRIAMCINOLONE ACETONIDE 40 MG/ML IJ SUSP
40.0000 mg | Freq: Once | INTRAMUSCULAR | Status: AC
  Administered 2020-03-31: 13:00:00 40 mg via INTRAMUSCULAR

## 2020-03-31 MED ORDER — TRIAMCINOLONE ACETONIDE 0.1 % EX CREA: TOPICAL_CREAM | CUTANEOUS | 0 refills | Status: DC

## 2020-03-31 MED FILL — TRIAMCINOLONE ACETONIDE 0.1: 0.1 | 20 days supply | Qty: 45 | Fill #0

## 2020-03-31 NOTE — Patient Instructions (Addendum)
[name] - [facility] [address] from [employer]

## 2020-04-01 ENCOUNTER — Encounter: Payer: Self-pay | Admitting: Internal Medicine

## 2020-04-01 NOTE — Progress Notes (Signed)
This visit occurred during the SARS-CoV-2 public health emergency.  Safety protocols were in place, including screening questions prior to the visit, additional usage of staff PPE, and extensive cleaning of exam room while observing appropriate contact time as indicated for disinfecting solutions.  Subjective:     Patient ID: [name] , female    DOB: 12/21/1975 , 44 y.o.   MRN: [id number]   Chief Complaint  Patient presents with  . Weight Check    HPI  She is here today for f/u obesity. She has been taking Contrave without any issues. She admits she is not yet exercising as much as she should.     Past Medical History:  Diagnosis Date  . Anxiety   . Constipation   . Constipation   . Joint pain   . Lactose intolerance   . Nervousness   . Obesity   . PCOS (polycystic ovarian syndrome)   . Sickle cell trait ([place])   . Vaginal delivery 1997, 2005, 2011  . Vitamin D deficiency      Family History  Problem Relation Age of Onset  . Hypertension Mother   . Alcoholism Mother   . Drug abuse Mother   . Hyperlipidemia Father   . Drug abuse Father   . Alcoholism Father   . Breast cancer Neg Hx      Current Outpatient Medications:  .  Cholecalciferol (VITAMIN D3) 250 MCG (10000 UT) TABS, Take 5 capsules by mouth once a week., Disp: , Rfl:  .  Naltrexone-buPROPion HCl ER (CONTRAVE) 8-90 MG TB12, Start 2 tabs po twice daily, Disp: 120 tablet, Rfl: 2 .  triamcinolone cream (KENALOG) 0.1 %, APPLY TO AFFECTED AREA TWICE DAILY AS NEEDED, Disp: 45 g, Rfl: 0   No Known Allergies   Review of Systems  Constitutional: Negative.   Respiratory: Negative.   Cardiovascular: Negative.   Gastrointestinal: Negative.   Skin:       States she was stung by a wasp on Monday. She has two areas of redness. It is tender to touch. She denies prior h/o bee/wasp allergy.   Neurological: Negative.   Psychiatric/Behavioral: Negative.        She admits to being under stress. There has been  tension between she and her spouse.      Today's Vitals   03/31/20 1152  BP: 118/72  Pulse: 77  Temp: 98.1 F (36.7 C)  TempSrc: Oral  Weight: 249 lb 6.4 oz (113.1 kg)  Height: 5' 6.4" (1.687 m)   Body mass index is 39.77 kg/m.   Wt Readings from Last 3 Encounters:  03/31/20 249 lb 6.4 oz (113.1 kg)  02/15/20 249 lb 3.2 oz (113 kg)  02/09/20 248 lb 12.8 oz (112.9 kg)     Objective:  Physical Exam Vitals and nursing note reviewed.  Constitutional:      Appearance: Normal appearance. She is obese.  HENT:     Head: Normocephalic and atraumatic.  Cardiovascular:     Rate and Rhythm: Normal rate and regular rhythm.     Heart sounds: Normal heart sounds.  Pulmonary:     Effort: Pulmonary effort is normal.     Breath sounds: Normal breath sounds.  Skin:    General: Skin is warm.     Comments: Skin overlying left shoulder is red, warm to touch, sl tender to touch. Two separate areas are noted.   Neurological:     General: No focal deficit present.     Mental Status:  She is alert.  Psychiatric:        Mood and Affect: Mood normal.        Behavior: Behavior normal.         Assessment And Plan:     1. Class 2 obesity due to excess calories without serious comorbidity with body mass index (BMI) of 39.0 to 39.9 in adult  She has not had any weight gain since her last visit. She is encouraged to incorporate at least 150 minutes of exercise per week. She will continue with Contrave. She will rto in 8-10 weeks for re-evaluation.   2. Wasp sting, accidental or unintentional, initial encounter  She was given Kenalog, 40mg  IM x1. Also advised to apply benadryl cream to affected area prn. She was also given rx triamcinolone cream to affected area twice daily as needed.   - triamcinolone acetonide (KENALOG-40) injection 40 mg  3. Family conflict  She is encouraged to seek counseling for herself. Advised to include husband eventually.   Maximino Greenland, MD    THE PATIENT  IS ENCOURAGED TO PRACTICE SOCIAL DISTANCING DUE TO THE COVID-19 PANDEMIC.

## 2020-04-02 ENCOUNTER — Other Ambulatory Visit: Payer: Self-pay | Admitting: Internal Medicine

## 2020-04-02 MED ORDER — EPINEPHRINE 0.3 MG/0.3ML IJ SOAJ: 0.3000 mg | INTRAMUSCULAR | 1 refills | Status: DC | PRN

## 2020-04-04 MED FILL — EPINEPHRINE 0.3 MG AUTO-INJ: 0.3 | 30 days supply | Qty: 2 | Fill #0

## 2020-06-02 ENCOUNTER — Ambulatory Visit: Payer: 59 | Admitting: Internal Medicine

## 2020-07-12 IMAGING — MG DIGITAL SCREENING BILATERAL MAMMOGRAM WITH TOMO AND CAD
8 series · 8 of 24 positions shown · non-contrast
Comparison: Previous exam(s).

CLINICAL DATA: Screening.

EXAM:
DIGITAL SCREENING BILATERAL MAMMOGRAM WITH TOMO AND CAD

[R MLO synth-2D]
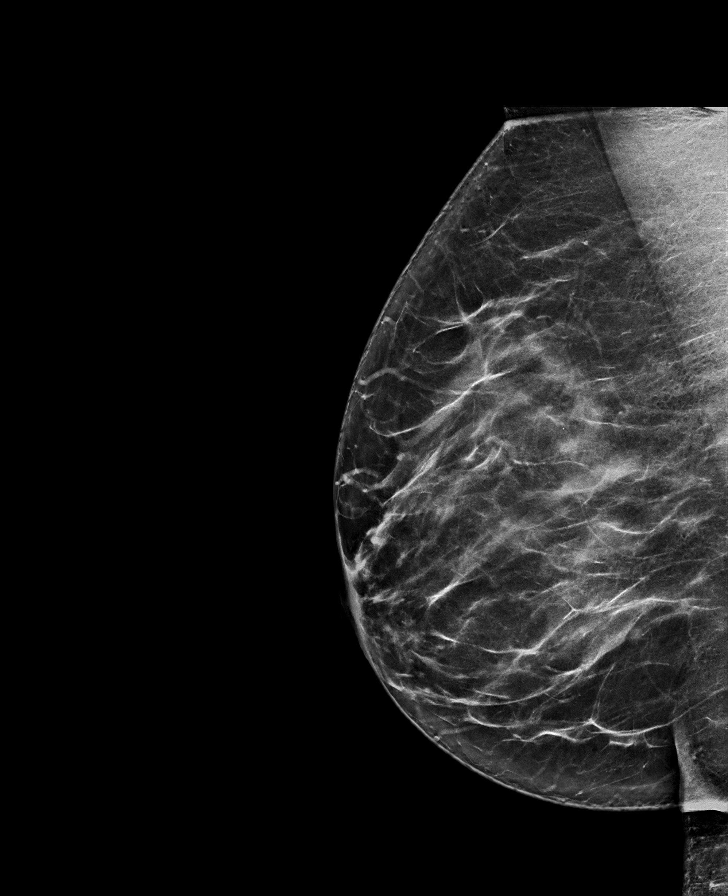

[R CC synth-2D]
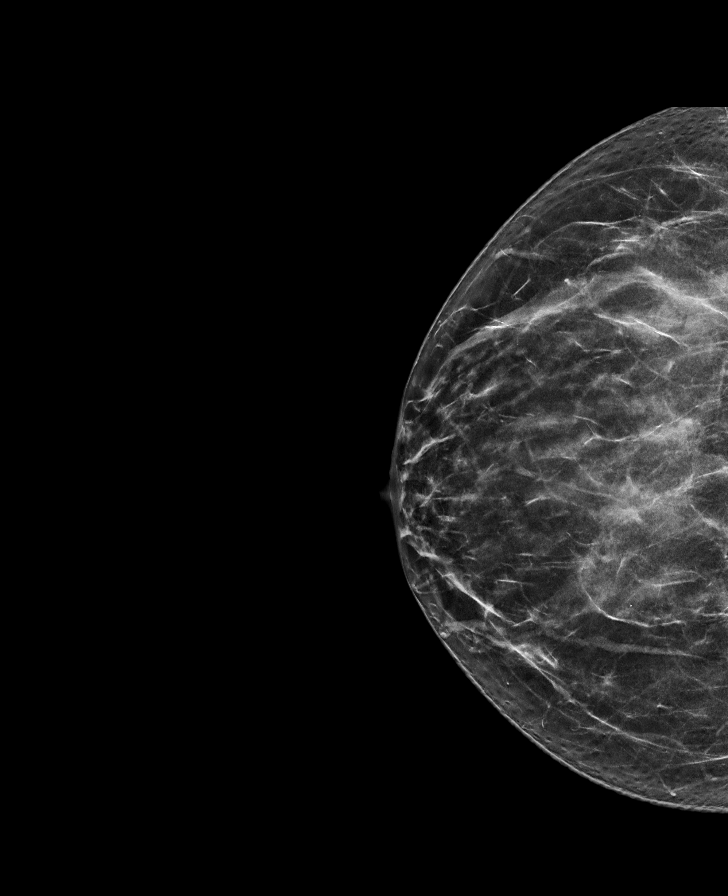

[L MLO synth-2D]
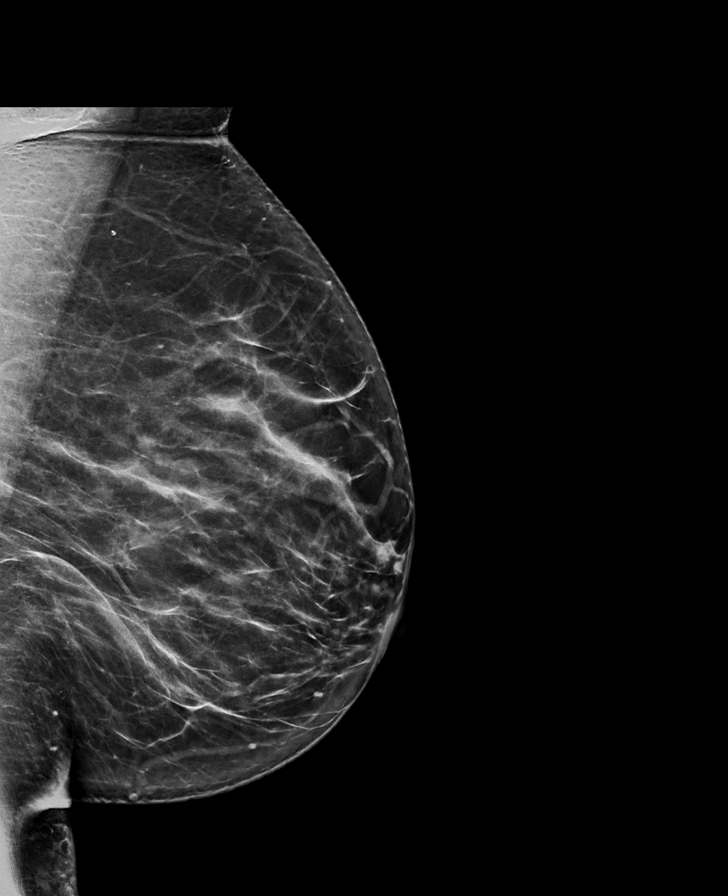

[L CC synth-2D]
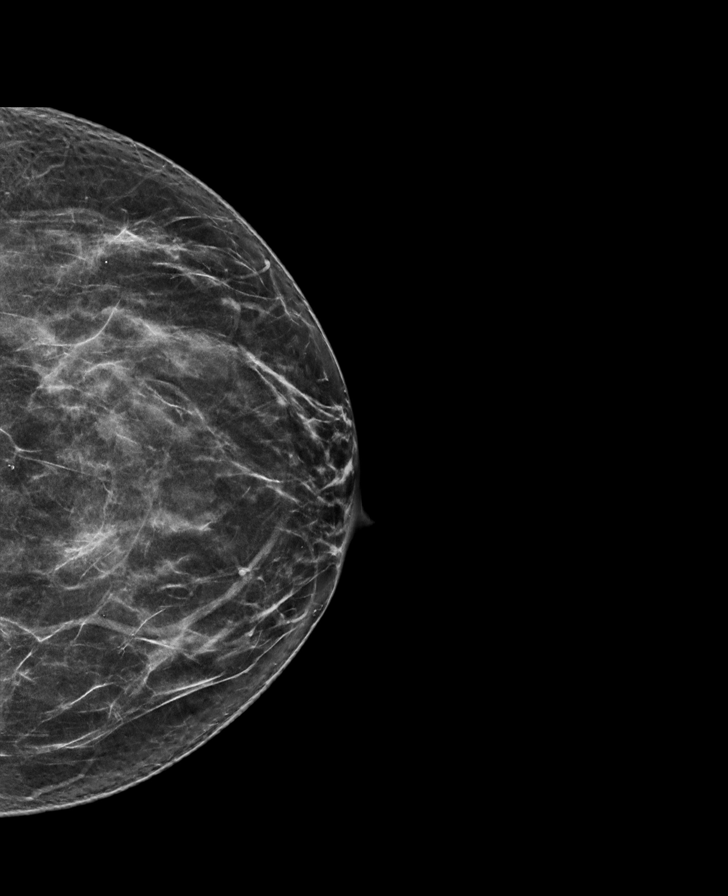

[L CC tomo · tomo slice 37/74.0]
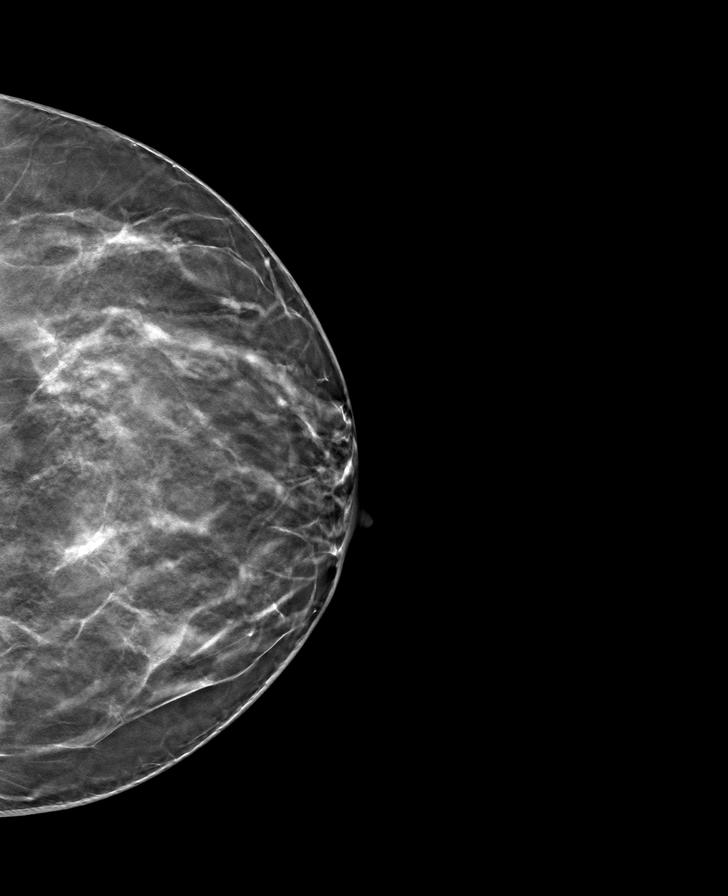

[R MLO tomo · tomo slice 43/85.0]
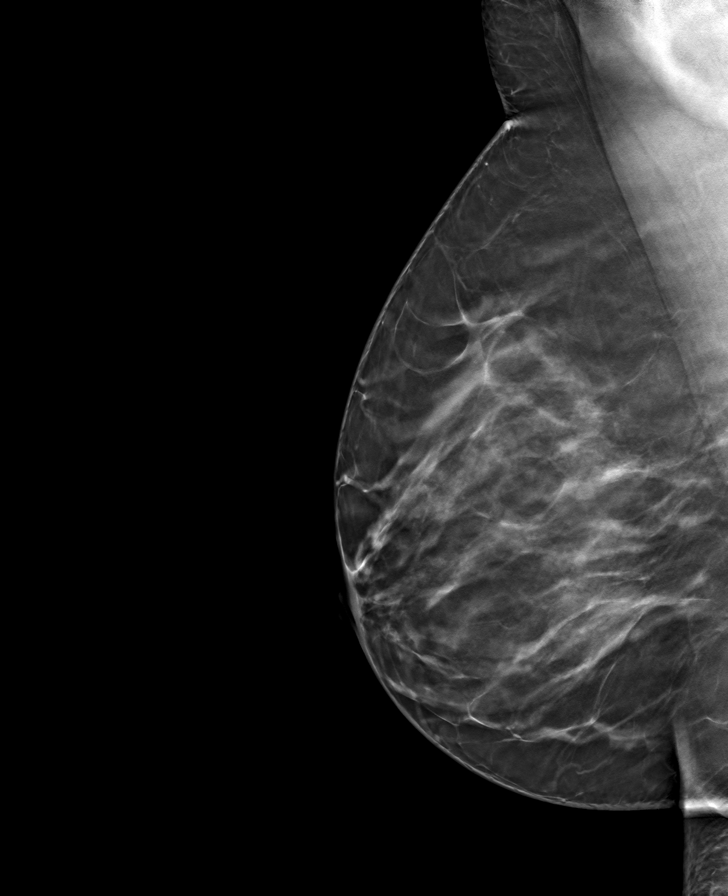

[R CC tomo · tomo slice 36/71.0]
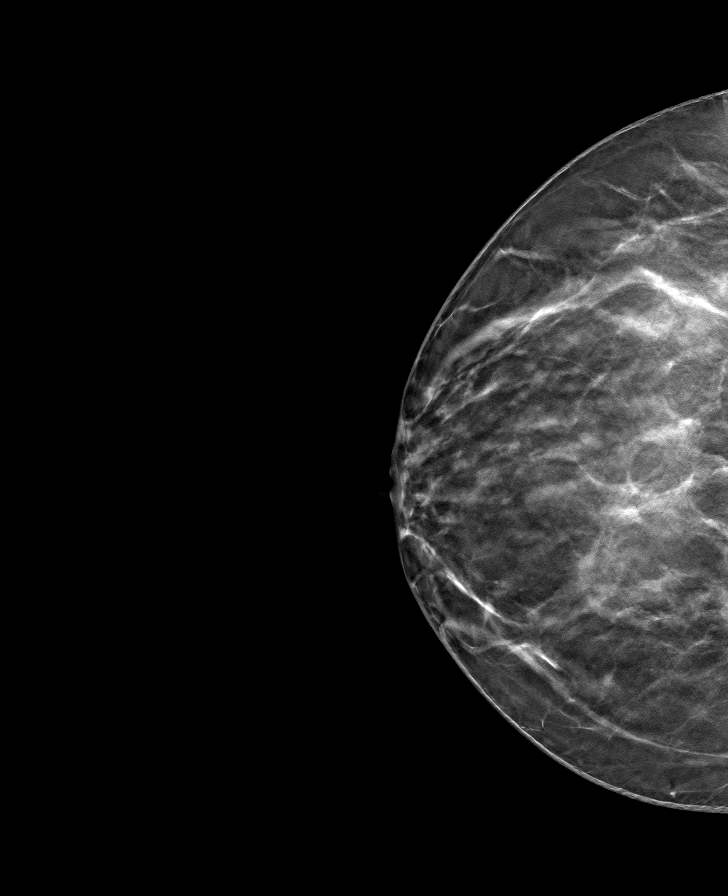

[L MLO tomo · tomo slice 43/85.0]
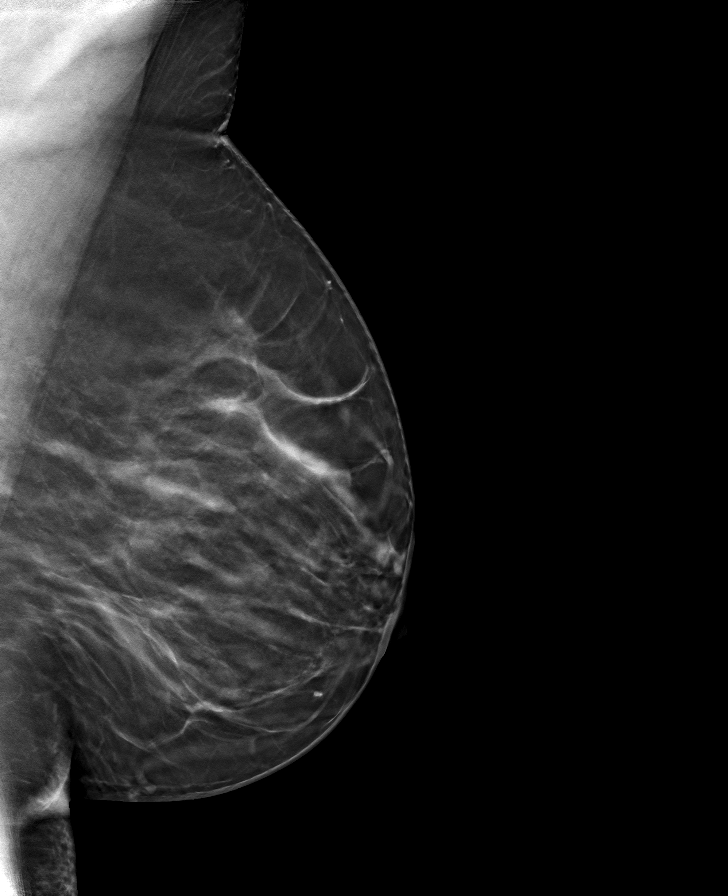

[8 of 24 positions shown; findings below may reference images not displayed]

ACR Breast Density Category c: The breast tissue is heterogeneously
dense, which may obscure small masses.
FINDINGS: There are no findings suspicious for malignancy. Images were
processed with CAD.
IMPRESSION: No mammographic evidence of malignancy. A result letter of this
screening mammogram will be mailed directly to the patient.

RECOMMENDATION:
Screening mammogram in one year. (Code:[id number]-[id number])

BI-RADS CATEGORY  1: Negative.

## 2020-07-25 ENCOUNTER — Other Ambulatory Visit (HOSPITAL_COMMUNITY): Payer: Self-pay | Admitting: Nurse Practitioner

## 2020-07-25 ENCOUNTER — Encounter: Payer: Self-pay | Admitting: Internal Medicine

## 2020-07-25 MED FILL — PROPRANOLOL 20 MG TABLET: 20 | 30 days supply | Qty: 30 | Fill #0

## 2020-10-05 MED FILL — PROPRANOLOL 20 MG TABLET: 20 | 30 days supply | Qty: 30 | Fill #1

## 2020-12-08 ENCOUNTER — Encounter: Payer: 59 | Admitting: Internal Medicine

## 2021-01-11 ENCOUNTER — Encounter: Payer: 59 | Admitting: Nurse Practitioner

## 2021-03-23 ENCOUNTER — Other Ambulatory Visit (HOSPITAL_COMMUNITY): Payer: Self-pay

## 2021-03-23 MED ORDER — BIKTARVY 50-200-25 MG PO TABS
1.0000 | ORAL_TABLET | Freq: Every day | ORAL | 0 refills | Status: DC
  Filled 2021-03-23: qty 30, 30d supply, fill #0

## 2021-09-06 DIAGNOSIS — Z0289 Encounter for other administrative examinations: Secondary | ICD-10-CM

## 2021-09-07 ENCOUNTER — Ambulatory Visit (INDEPENDENT_AMBULATORY_CARE_PROVIDER_SITE_OTHER): Payer: No Typology Code available for payment source | Admitting: Bariatrics

## 2021-09-07 ENCOUNTER — Encounter (INDEPENDENT_AMBULATORY_CARE_PROVIDER_SITE_OTHER): Payer: Self-pay | Admitting: Bariatrics

## 2021-09-07 ENCOUNTER — Other Ambulatory Visit: Payer: Self-pay

## 2021-09-07 VITALS — BP 139/86 | HR 85 | Temp 98.2°F | Ht 66.0 in | Wt 244.0 lb

## 2021-09-07 DIAGNOSIS — E538 Deficiency of other specified B group vitamins: Secondary | ICD-10-CM | POA: Diagnosis not present

## 2021-09-07 DIAGNOSIS — R0602 Shortness of breath: Secondary | ICD-10-CM

## 2021-09-07 DIAGNOSIS — R7309 Other abnormal glucose: Secondary | ICD-10-CM

## 2021-09-07 DIAGNOSIS — R5383 Other fatigue: Secondary | ICD-10-CM

## 2021-09-07 DIAGNOSIS — E6609 Other obesity due to excess calories: Secondary | ICD-10-CM | POA: Insufficient documentation

## 2021-09-07 DIAGNOSIS — Z1331 Encounter for screening for depression: Secondary | ICD-10-CM | POA: Diagnosis not present

## 2021-09-07 DIAGNOSIS — E7800 Pure hypercholesterolemia, unspecified: Secondary | ICD-10-CM

## 2021-09-07 DIAGNOSIS — Z9189 Other specified personal risk factors, not elsewhere classified: Secondary | ICD-10-CM | POA: Diagnosis not present

## 2021-09-07 DIAGNOSIS — E559 Vitamin D deficiency, unspecified: Secondary | ICD-10-CM

## 2021-09-07 DIAGNOSIS — Z6839 Body mass index (BMI) 39.0-39.9, adult: Secondary | ICD-10-CM

## 2021-09-11 NOTE — Progress Notes (Signed)
.   Chief Complaint:   [name] [name] (MR# [id number]) is a 45 y.o. female who presents for evaluation and treatment of [name] and related comorbidities. Current BMI is Body mass index is 39.38 kg/m. [name] has been struggling with her weight for many years and has been unsuccessful in either losing weight, maintaining weight loss, or reaching her healthy weight goal.  [name] is lactose intolerant. She likes to cook. She notes she craves carbohydrates mainly. She is a returning patient to our clinic from 2020.  [name] is currently in the action stage of change and ready to dedicate time achieving and maintaining a healthier weight. [name] is interested in becoming our patient and working on intensive lifestyle modifications including (but not limited to) diet and exercise for weight loss.  [name]'s habits were reviewed today and are as follows: she thinks her family will eat healthier with her, she struggles with family and or coworkers weight loss sabotage, her desired weight loss is 59 pounds, she has been heavy most of her life, she started gaining weight after pregnancies, her heaviest weight ever was 260 pounds, she has significant food cravings issues, she snacks frequently in the evenings, she skips meals frequently, she is frequently drinking liquids with calories, she frequently makes poor food choices, she frequently eats larger portions than normal, and she struggles with emotional eating.  Depression Screen [name]'s Food and Mood (modified PHQ-9) score was 16.  Depression screen [facility] 2/9 09/07/2021  Decreased Interest 2  Down, Depressed, Hopeless 1  PHQ - 2 Score 3  Altered sleeping 1  Tired, decreased energy 3  Change in appetite 3  Feeling bad or failure about yourself  3  Trouble concentrating 3  Moving slowly or fidgety/restless 0  Suicidal thoughts 0  PHQ-9 Score 16  Difficult doing work/chores Not difficult at all   Subjective:   1. Other fatigue [name] admits to  daytime somnolence and denies waking up still tired. Patent has a history of symptoms of daytime fatigue. [name] generally gets 8 hours of sleep per night, and states that she has difficulty falling asleep. Snoring is not present. Apneic episodes is not present. Epworth Sleepiness Score is 3.   2. SOB (shortness of breath) on exertion [name] notes increasing shortness of breath with exercising and seems to be worsening over time with weight gain. She notes getting out of breath sooner with activity than she used to. This has not gotten worse recently. [name] denies shortness of breath at rest or orthopnea.   3. Vitamin D deficiency [name] is currently not on Vitamin D.  4. B12 nutritional deficiency [name] is not on Vitamin B 12 currently.  5. Elevated cholesterol [name] is currently not on medications.    6. Elevated glucose [name] is currently not on medications.   7. At risk for activity intolerance [name] is at risk for activity intolerance due to [name] and joint pain.  Assessment/Plan:   1. Other fatigue [name] does feel that her weight is causing her energy to be lower than it should be. Fatigue may be related to [name], depression or many other causes. Labs will be ordered, and in the meanwhile, [name] will focus on self care including making healthy food choices, increasing physical activity and focusing on stress reduction.  - EKG 12-Lead - T3 - T4, free - TSH  2. SOB (shortness of breath) on exertion [name] does feel that she gets out of breath more easily that she used to when she exercises. [name]'s  shortness of breath appears to be [name] related and exercise induced. She has agreed to work on weight loss and gradually increase exercise to treat her exercise induced shortness of breath. Will continue to monitor closely.   3. Vitamin D deficiency Low Vitamin D level contributes to fatigue and are associated with [name], breast, and colon cancer. We will check labs today. [name]  will follow-up for routine testing of Vitamin D, at least 2-3 times per year to avoid over-replacement.  - VITAMIN D 25 Hydroxy (Vit-D Deficiency, Fractures)  4. B12 nutritional deficiency The diagnosis was reviewed with the patient. Counseling provided today, see below. We will check labs today, and will continue to monitor. Orders and follow up as documented in patient record.  Counseling The body needs vitamin B12: to make red blood cells; to make DNA; and to help the nerves work properly so they can carry messages from the brain to the body.  The main causes of vitamin B12 deficiency include dietary deficiency, digestive diseases, pernicious anemia, and having a surgery in which part of the stomach or small intestine is removed.  Certain medicines can make it harder for the body to absorb vitamin B12. These medicines include: heartburn medications; some antibiotics; some medications used to treat diabetes, gout, and high cholesterol.  In some cases, there are no symptoms of this condition. If the condition leads to anemia or nerve damage, various symptoms can occur, such as weakness or fatigue, shortness of breath, and numbness or tingling in your hands and feet.   Treatment:  May include taking vitamin B12 supplements.  Avoid alcohol.  Eat lots of healthy foods that contain vitamin B12: Beef, pork, chicken, [place], and organ meats, such as liver.  Seafood: This includes clams, rainbow trout, salmon, tuna, and haddock. Eggs.  Cereal and dairy products that are fortified: This means that vitamin B12 has been added to the food.    - T3 - T4, free - TSH - Vitamin B12  5. Elevated cholesterol Cardiovascular risk and specific lipid/LDL goals reviewed.  We discussed several lifestyle modifications today and [name] will continue to work on diet, exercise and weight loss efforts. We will check Lipids today. Orders and follow up as documented in patient record.   Counseling Intensive  lifestyle modifications are the first line treatment for this issue. Dietary changes: Increase soluble fiber. Decrease simple carbohydrates. Exercise changes: Moderate to vigorous-intensity aerobic activity 150 minutes per week if tolerated. Lipid-lowering medications: see documented in medical record.  - Comprehensive metabolic panel - Lipid Panel With LDL/HDL Ratio  6. Elevated glucose We will check A1C and Insulin today. Makayela will continue to follow up as directed.  - Hemoglobin A1c - Insulin, random  7. Depression screen Jerrine had a positive depression screening. Depression is commonly associated with [name] and often results in emotional eating behaviors. We will monitor this closely and work on CBT to help improve the non-hunger eating patterns. Referral to Psychology may be required if no improvement is seen as she continues in our clinic.   8. At risk for activity intolerance Christinia was given approximately 15 minutes of exercise intolerance counseling today. She is 45 y.o. female and has risk factors exercise intolerance including [name]. We discussed intensive lifestyle modifications today with an emphasis on specific weight loss instructions and strategies. Elsi will slowly increase activity as tolerated.  Repetitive spaced learning was employed today to elicit superior memory formation and behavioral change.   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 I recommend Maddie begin the structured treatment plan as follows:  Javeria will continue meal planning and intentional eating. She will have zero regular sodas.  She has agreed to the Category 3 Plan.  Exercise goals: No exercise has been prescribed at this time.   Behavioral modification strategies: increasing lean protein intake,  decreasing simple carbohydrates, increasing vegetables, increasing water intake, decreasing eating out, no skipping meals, meal planning and cooking strategies, keeping healthy foods in the home, and planning for success.  She was informed of the importance of frequent follow-up visits to maximize her success with intensive lifestyle modifications for her multiple health conditions. She was informed we would discuss her lab results at her next visit unless there is a critical issue that needs to be addressed sooner. Joanna agreed to keep her next visit at the agreed upon time to discuss these results.  Objective:   Blood pressure 139/86, pulse 85, temperature 98.2 F (36.8 C), height 5\' 6"  (1.676 m), weight 244 lb (110.7 kg), SpO2 98 %. Body mass index is 39.38 kg/m.  EKG: Normal sinus rhythm, rate 78 BPM.  Indirect Calorimeter completed today shows a VO2 of 282 and a REE of 1944.  Her calculated basal metabolic rate is 9021 thus her basal metabolic rate is worse than expected.  General: Cooperative, alert, well developed, in no acute distress. HEENT: Conjunctivae and lids unremarkable. Cardiovascular: Regular rhythm.  Lungs: Normal work of breathing. Neurologic: No focal deficits.   Lab Results  Component Value Date   CREATININE 0.90 09/07/2021   BUN 11 09/07/2021   NA 138 09/07/2021   K 4.2 09/07/2021   CL 101 09/07/2021   CO2 23 09/07/2021   Lab Results  Component Value Date   ALT 14 09/07/2021   AST 16 09/07/2021   ALKPHOS 56 09/07/2021   BILITOT 0.4 09/07/2021   Lab Results  Component Value Date   HGBA1C 5.3 09/07/2021   HGBA1C 5.0 11/24/2019   HGBA1C 5.0 12/08/2018   HGBA1C 5.2 09/11/2018   HGBA1C 5.1 06/12/2018   Lab Results  Component Value Date   INSULIN 9.3 09/07/2021   INSULIN 9.0 12/08/2018   INSULIN 9.7 09/11/2018   Lab Results  Component Value Date   TSH 0.684 09/07/2021   Lab Results  Component Value Date   CHOL 207 (H) 09/07/2021   HDL 61  09/07/2021   LDLCALC 130 (H) 09/07/2021   TRIG 92 09/07/2021   CHOLHDL 3.8 11/24/2019   Lab Results  Component Value Date   WBC 5.8 11/24/2019   HGB 12.9 11/24/2019   HCT 39.7 11/24/2019   MCV 90 11/24/2019   PLT 318 11/24/2019   No results found for: IRON, TIBC, FERRITIN  Attestation Statements:   Reviewed by clinician on day of visit: allergies, medications, problem list, medical history, surgical history, family history, social history, and previous encounter notes.  I, Lizbeth Bark, RMA, am acting as Location manager for [employer], DO.   I have reviewed the above documentation for accuracy and completeness, and I agree with the above. Jearld Lesch, DO

## 2021-09-12 ENCOUNTER — Encounter (INDEPENDENT_AMBULATORY_CARE_PROVIDER_SITE_OTHER): Payer: Self-pay | Admitting: Bariatrics

## 2021-09-12 LAB — COMPREHENSIVE METABOLIC PANEL
ALT: 14 IU/L (ref 0–32)
AST: 16 IU/L (ref 0–40)
Albumin/Globulin Ratio: 1.7 (ref 1.2–2.2)
Albumin: 4.5 g/dL (ref 3.8–4.8)
Alkaline Phosphatase: 56 IU/L (ref 44–121)
BUN/Creatinine Ratio: 12 (ref 9–23)
BUN: 11 mg/dL (ref 6–24)
Bilirubin Total: 0.4 mg/dL (ref 0.0–1.2)
CO2: 23 mmol/L (ref 20–29)
Calcium: 9.3 mg/dL (ref 8.7–10.2)
Chloride: 101 mmol/L (ref 96–106)
Creatinine, Ser: 0.9 mg/dL (ref 0.57–1.00)
Globulin, Total: 2.6 g/dL (ref 1.5–4.5)
Glucose: 96 mg/dL (ref 65–99)
Potassium: 4.2 mmol/L (ref 3.5–5.2)
Sodium: 138 mmol/L (ref 134–144)
Total Protein: 7.1 g/dL (ref 6.0–8.5)
eGFR: 81 mL/min/{1.73_m2} (ref >59)

## 2021-09-12 LAB — LIPID PANEL WITH LDL/HDL RATIO
Cholesterol, Total: 207 mg/dL — ABNORMAL HIGH (ref 100–199)
HDL: 61 mg/dL
LDL Chol Calc (NIH): 130 mg/dL — ABNORMAL HIGH (ref 0–99)
LDL/HDL Ratio: 2.1 ratio (ref 0.0–3.2)
Triglycerides: 92 mg/dL (ref 0–149)
VLDL Cholesterol Cal: 16 mg/dL (ref 5–40)

## 2021-09-12 LAB — T3: T3, Total: 112 ng/dL (ref 71–180)

## 2021-09-12 LAB — HEMOGLOBIN A1C
Est. average glucose Bld gHb Est-mCnc: 105 mg/dL
Hgb A1c MFr Bld: 5.3 % (ref 4.8–5.6)

## 2021-09-12 LAB — INSULIN, RANDOM: INSULIN: 9.3 u[IU]/mL (ref 2.6–24.9)

## 2021-09-12 LAB — T4, FREE: Free T4: 1.33 ng/dL (ref 0.82–1.77)

## 2021-09-12 LAB — TSH: TSH: 0.684 u[IU]/mL (ref 0.450–4.500)

## 2021-09-12 LAB — VITAMIN B12: Vitamin B-12: 436 pg/mL (ref 232–1245)

## 2021-09-12 LAB — VITAMIN D 25 HYDROXY (VIT D DEFICIENCY, FRACTURES): Vit D, 25-Hydroxy: 32.9 ng/mL (ref 30.0–100.0)

## 2021-09-21 ENCOUNTER — Encounter (INDEPENDENT_AMBULATORY_CARE_PROVIDER_SITE_OTHER): Payer: Self-pay | Admitting: Bariatrics

## 2021-09-21 ENCOUNTER — Other Ambulatory Visit: Payer: Self-pay

## 2021-09-21 ENCOUNTER — Ambulatory Visit (INDEPENDENT_AMBULATORY_CARE_PROVIDER_SITE_OTHER): Payer: No Typology Code available for payment source | Admitting: Bariatrics

## 2021-09-21 VITALS — BP 132/83 | HR 99 | Temp 98.4°F | Ht 66.0 in | Wt 242.0 lb

## 2021-09-21 DIAGNOSIS — E559 Vitamin D deficiency, unspecified: Secondary | ICD-10-CM

## 2021-09-21 DIAGNOSIS — E7800 Pure hypercholesterolemia, unspecified: Secondary | ICD-10-CM

## 2021-09-21 DIAGNOSIS — Z6839 Body mass index (BMI) 39.0-39.9, adult: Secondary | ICD-10-CM

## 2021-09-21 DIAGNOSIS — E8881 Metabolic syndrome: Secondary | ICD-10-CM

## 2021-09-21 MED ORDER — TIRZEPATIDE 2.5 MG/0.5ML ~~LOC~~ SOAJ: 2.5000 mg | SUBCUTANEOUS | 0 refills | Status: DC

## 2021-09-21 MED ORDER — VITAMIN D (ERGOCALCIFEROL) 1.25 MG (50000 UNIT) PO CAPS
50000.0000 [IU] | ORAL_CAPSULE | ORAL | 0 refills | Status: DC
Start: 2021-09-21 — End: 2021-10-05

## 2021-09-21 NOTE — Progress Notes (Signed)
Chief Complaint:   OBESITY [name] is here to discuss her progress with her obesity treatment plan along with follow-up of her obesity related diagnoses. [name] is on the Category 3 Plan and states she is following her eating plan approximately 88% of the time. [name] states she is doing 0 minutes 0 times per week.  Today's visit was #: 3 Starting weight: 244 lbs Starting date: 09/07/2021 Today's weight: 242 lbs Today's date: 09/21/2021 Total lbs lost to date: 2 lbs Total lbs lost since last in-office visit: 2 lbs  Interim History: [name] is down 2 lbs since her last visit. She states that it is challenging. She wanted carbohydrates but substituted with other foods and hence did ok.   Subjective:   1. Elevated cholesterol [name] is not currently on medications. Her total cholesterol level was 207. Her LDL was 130 and HDL was 61.  2. Vitamin D deficiency [name] is currently not on medications. Her Vitamin D level was 32.9.  3. Insulin resistance [name] has no contraindications. She has a partial hysterectomy. She has an appetite and occasional salt cravings.   Assessment/Plan:   1. Elevated cholesterol Cardiovascular risk and specific lipid/LDL goals reviewed.  We discussed several lifestyle modifications today and [name] will continue to work on diet, exercise and weight loss efforts. Handouts on better protein and fats were given today.   2. Vitamin D deficiency Low Vitamin D level contributes to fatigue and are associated with obesity, breast, and colon cancer. We will refill prescription Vitamin D 50,000 IU every week for 1 month with no refills and [name] will follow-up for routine testing of Vitamin D, at least 2-3 times per year to avoid over-replacement.  - Vitamin D, Ergocalciferol, (DRISDOL) 1.25 MG (50000 UNIT) CAPS capsule; Take 1 capsule (50,000 Units total) by mouth every 7 (seven) days.  Dispense: 4 capsule; Refill: 0  3. Insulin resistance [name] will continue to work  on weight loss, exercise, and decreasing simple carbohydrates to help decrease the risk of diabetes. [name] agreed to follow-up with [place] as directed to closely monitor her progress. We will refill Mounjaro 2.5 mg weekly for 1 month with no refills.   - tirzepatide [facility]) 2.5 MG/0.5ML Pen; Inject 2.5 mg into the skin once a week.  Dispense: 2 mL; Refill: 0  4. Obesity, current BMI 39.1 [name] is currently in the action stage of change. As such, her goal is to continue with weight loss efforts. She has agreed to the Category 3 Plan.   [name] will continue meal planning and intentional eating. We will review labs from 09/07/2021. Handout: [place] was given today.  Exercise goals: No exercise has been prescribed at this time.  Behavioral modification strategies: increasing lean protein intake, decreasing simple carbohydrates, increasing vegetables, increasing water intake, decreasing eating out, no skipping meals, meal planning and cooking strategies, keeping healthy foods in the home, and planning for success.  [name] has agreed to follow-up with our clinic in 2 weeks. She was informed of the importance of frequent follow-up visits to maximize her success with intensive lifestyle modifications for her multiple health conditions.   Objective:   Blood pressure 132/83, pulse 99, temperature 98.4 F (36.9 C), height 5\' 6"  (1.676 m), weight 242 lb (109.8 kg), SpO2 100 %. Body mass index is 39.06 kg/m.  General: Cooperative, alert, well developed, in no acute distress. HEENT: Conjunctivae and lids unremarkable. Cardiovascular: Regular rhythm.  Lungs: Normal work of breathing. Neurologic: No focal deficits.  Lab Results  Component Value Date   CREATININE 0.90 09/07/2021   BUN 11 09/07/2021   NA 138 09/07/2021   K 4.2 09/07/2021   CL 101 09/07/2021   CO2 23 09/07/2021   Lab Results  Component Value Date   ALT 14 09/07/2021   AST 16 09/07/2021   ALKPHOS 56 09/07/2021   BILITOT 0.4  09/07/2021   Lab Results  Component Value Date   HGBA1C 5.3 09/07/2021   HGBA1C 5.0 11/24/2019   HGBA1C 5.0 12/08/2018   HGBA1C 5.2 09/11/2018   HGBA1C 5.1 06/12/2018   Lab Results  Component Value Date   INSULIN 9.3 09/07/2021   INSULIN 9.0 12/08/2018   INSULIN 9.7 09/11/2018   Lab Results  Component Value Date   TSH 0.684 09/07/2021   Lab Results  Component Value Date   CHOL 207 (H) 09/07/2021   HDL 61 09/07/2021   LDLCALC 130 (H) 09/07/2021   TRIG 92 09/07/2021   CHOLHDL 3.8 11/24/2019   Lab Results  Component Value Date   VD25OH 32.9 09/07/2021   VD25OH 36.9 12/08/2018   VD25OH 30.9 09/11/2018   Lab Results  Component Value Date   WBC 5.8 11/24/2019   HGB 12.9 11/24/2019   HCT 39.7 11/24/2019   MCV 90 11/24/2019   PLT 318 11/24/2019   No results found for: IRON, TIBC, FERRITIN  Attestation Statements:   Reviewed by clinician on day of visit: allergies, medications, problem list, medical history, surgical history, family history, social history, and previous encounter notes.  Time spent on visit including pre-visit chart review and post-visit care and charting was 30 minutes.   I, [name], RMA, am acting as Location manager for [employer], DO.   I have reviewed the above documentation for accuracy and completeness, and I agree with the above. Emma Lesch, DO

## 2021-09-25 ENCOUNTER — Encounter (INDEPENDENT_AMBULATORY_CARE_PROVIDER_SITE_OTHER): Payer: Self-pay | Admitting: Bariatrics

## 2021-09-28 ENCOUNTER — Encounter: Payer: Self-pay | Admitting: Internal Medicine

## 2021-10-05 ENCOUNTER — Encounter (INDEPENDENT_AMBULATORY_CARE_PROVIDER_SITE_OTHER): Payer: Self-pay | Admitting: Bariatrics

## 2021-10-05 ENCOUNTER — Other Ambulatory Visit: Payer: Self-pay

## 2021-10-05 ENCOUNTER — Ambulatory Visit (INDEPENDENT_AMBULATORY_CARE_PROVIDER_SITE_OTHER): Payer: No Typology Code available for payment source | Admitting: Bariatrics

## 2021-10-05 VITALS — BP 127/81 | HR 103 | Temp 98.1°F | Ht 66.0 in | Wt 236.0 lb

## 2021-10-05 DIAGNOSIS — Z6839 Body mass index (BMI) 39.0-39.9, adult: Secondary | ICD-10-CM | POA: Diagnosis not present

## 2021-10-05 DIAGNOSIS — E559 Vitamin D deficiency, unspecified: Secondary | ICD-10-CM

## 2021-10-05 DIAGNOSIS — E8881 Metabolic syndrome: Secondary | ICD-10-CM | POA: Diagnosis not present

## 2021-10-05 MED ORDER — VITAMIN D (ERGOCALCIFEROL) 1.25 MG (50000 UNIT) PO CAPS: 50000.0000 [IU] | ORAL_CAPSULE | ORAL | 0 refills | Status: DC

## 2021-10-05 MED ORDER — TIRZEPATIDE 2.5 MG/0.5ML ~~LOC~~ SOAJ: 2.5000 mg | SUBCUTANEOUS | 0 refills | Status: DC

## 2021-10-05 NOTE — Progress Notes (Signed)
Chief Complaint:   OBESITY [name] is here to discuss her progress with her obesity treatment plan along with follow-up of her obesity related diagnoses. [name] is on the Category 3 Plan and states she is following her eating plan approximately 93% of the time. [name] states she is doing 0 minutes 0 times per week.  Today's visit was #: 4 Starting weight: 244 lbs Starting date: 09/07/2021 Today's weight: 236 lbs Today's date: 10/05/2021 Total lbs lost to date: 8 lbs Total lbs lost since last in-office visit: 6 lbs  Interim History: [name] is down an additional 6 lbs since her last visit. She has to be more intentional about eating. She is doing well with her water intake.   Subjective:   1. Vitamin D deficiency She is currently taking prescription vitamin D 50,000 IU each week. She denies nausea, vomiting or muscle weakness.  2. Insulin resistance [name] is taking her medications as directed.   Assessment/Plan:   1. Vitamin D deficiency Low Vitamin D level contributes to fatigue and are associated with obesity, breast, and colon cancer. We will refill  prescription Vitamin D 50,000 IU every week for 1 month with no refills and [name] will follow-up for routine testing of Vitamin D, at least 2-3 times per year to avoid over-replacement.  - Vitamin D, Ergocalciferol, (DRISDOL) 1.25 MG (50000 UNIT) CAPS capsule; Take 1 capsule (50,000 Units total) by mouth every 7 (seven) days.  Dispense: 4 capsule; Refill: 0  2. Insulin resistance [name] will continue to work on weight loss, exercise, and decreasing simple carbohydrates to help decrease the risk of diabetes. We will refill Mounjaro 2.5 mg with no refills [name] agreed to follow-up with [place] as directed to closely monitor her progress.  - tirzepatide [facility]) 2.5 MG/0.5ML Pen; Inject 2.5 mg into the skin once a week.  Dispense: 2 mL; Refill: 0  3. Obesity, current BMI 38.2 [name] is currently in the action stage of change. As such, her  goal is to continue with weight loss efforts. She has agreed to the Category 3 Plan.   [name] will continue meal planning and intentional eating. She denies meal skipping.  Exercise goals:  [name] will start walking.   Behavioral modification strategies: increasing lean protein intake, decreasing simple carbohydrates, increasing vegetables, increasing water intake, decreasing eating out, no skipping meals, meal planning and cooking strategies, keeping healthy foods in the home, and planning for success.  [name] has agreed to follow-up with our clinic in 2 weeks. She was informed of the importance of frequent follow-up visits to maximize her success with intensive lifestyle modifications for her multiple health conditions.   Objective:   Blood pressure 127/81, pulse (!) 103, temperature 98.1 F (36.7 C), height 5\' 6"  (1.676 m), weight 236 lb (107 kg), SpO2 100 %. Body mass index is 38.09 kg/m.  General: Cooperative, alert, well developed, in no acute distress. HEENT: Conjunctivae and lids unremarkable. Cardiovascular: Regular rhythm.  Lungs: Normal work of breathing. Neurologic: No focal deficits.   Lab Results  Component Value Date   CREATININE 0.90 09/07/2021   BUN 11 09/07/2021   NA 138 09/07/2021   K 4.2 09/07/2021   CL 101 09/07/2021   CO2 23 09/07/2021   Lab Results  Component Value Date   ALT 14 09/07/2021   AST 16 09/07/2021   ALKPHOS 56 09/07/2021   BILITOT 0.4 09/07/2021   Lab Results  Component Value Date   HGBA1C 5.3 09/07/2021   HGBA1C 5.0 11/24/2019  HGBA1C 5.0 12/08/2018   HGBA1C 5.2 09/11/2018   HGBA1C 5.1 06/12/2018   Lab Results  Component Value Date   INSULIN 9.3 09/07/2021   INSULIN 9.0 12/08/2018   INSULIN 9.7 09/11/2018   Lab Results  Component Value Date   TSH 0.684 09/07/2021   Lab Results  Component Value Date   CHOL 207 (H) 09/07/2021   HDL 61 09/07/2021   LDLCALC 130 (H) 09/07/2021   TRIG 92 09/07/2021   CHOLHDL 3.8 11/24/2019    Lab Results  Component Value Date   VD25OH 32.9 09/07/2021   VD25OH 36.9 12/08/2018   VD25OH 30.9 09/11/2018   Lab Results  Component Value Date   WBC 5.8 11/24/2019   HGB 12.9 11/24/2019   HCT 39.7 11/24/2019   MCV 90 11/24/2019   PLT 318 11/24/2019   No results found for: IRON, TIBC, FERRITIN  Attestation Statements:   Reviewed by clinician on day of visit: allergies, medications, problem list, medical history, surgical history, family history, social history, and previous encounter notes.    I, Lizbeth Bark, RMA, am acting as Location manager for [employer], DO.   I have reviewed the above documentation for accuracy and completeness, and I agree with the above. Jearld Lesch, DO

## 2021-10-09 ENCOUNTER — Encounter (INDEPENDENT_AMBULATORY_CARE_PROVIDER_SITE_OTHER): Payer: Self-pay | Admitting: Bariatrics

## 2021-10-12 ENCOUNTER — Other Ambulatory Visit (INDEPENDENT_AMBULATORY_CARE_PROVIDER_SITE_OTHER): Payer: Self-pay | Admitting: Bariatrics

## 2021-10-12 DIAGNOSIS — E8881 Metabolic syndrome: Secondary | ICD-10-CM

## 2021-10-12 MED ORDER — TIRZEPATIDE 2.5 MG/0.5ML ~~LOC~~ SOAJ: 2.5000 mg | SUBCUTANEOUS | 0 refills | Status: DC

## 2021-10-12 NOTE — Telephone Encounter (Signed)
Pt called in and stated that she would like call from the nurse. She said that she talked to Dr. Owens Shark & she is confused. Please advise

## 2021-10-12 NOTE — Telephone Encounter (Signed)
Pt reports no side effects while taking Monjauro and she did not pick up the last rx, waiting for the increase to 5mg  to be sent in.  LAST APPOINTMENT DATE: 10/05/21 NEXT APPOINTMENT DATE: 10/23/21   [name] [place] [address] Phone: [phone] Fax: [phone]  Patient is requesting a refill of the following medications: Pending Prescriptions:                       Disp   Refills   tirzepatide (MOUNJARO) 2.5 MG/0.5ML Pen    2 mL   0       Sig: Inject 2.5 mg into the skin once a week.   Date last filled: 10/05/21 Previously prescribed by Dr. Owens Shark  Lab Results      Component                Value               Date                      HGBA1C                   5.3                 09/07/2021                HGBA1C                   5.0                 11/24/2019                HGBA1C                   5.0                 12/08/2018           Lab Results      Component                Value               Date                      LDLCALC                  130 (H)             09/07/2021                CREATININE               0.90                09/07/2021           Lab Results      Component                Value               Date                      VD25OH                   32.9                09/07/2021  VD25OH                   36.9                12/08/2018                VD25OH                   30.9                09/11/2018            BP Readings from Last 3 Encounters: 10/05/21 : 127/81 09/21/21 : 132/83 09/07/21 : 139/86

## 2021-10-12 NOTE — Telephone Encounter (Signed)
Dr.Brown 

## 2021-10-17 ENCOUNTER — Other Ambulatory Visit (INDEPENDENT_AMBULATORY_CARE_PROVIDER_SITE_OTHER): Payer: Self-pay | Admitting: Bariatrics

## 2021-10-17 DIAGNOSIS — E8881 Metabolic syndrome: Secondary | ICD-10-CM

## 2021-10-17 NOTE — Telephone Encounter (Signed)
Dr.Brown 

## 2021-10-18 ENCOUNTER — Other Ambulatory Visit (HOSPITAL_COMMUNITY): Payer: Self-pay

## 2021-10-18 ENCOUNTER — Other Ambulatory Visit (INDEPENDENT_AMBULATORY_CARE_PROVIDER_SITE_OTHER): Payer: Self-pay

## 2021-10-18 DIAGNOSIS — E8881 Metabolic syndrome: Secondary | ICD-10-CM

## 2021-10-18 MED ORDER — TIRZEPATIDE 5 MG/0.5ML ~~LOC~~ SOAJ
5.0000 mg | SUBCUTANEOUS | 0 refills | Status: DC
  Filled 2021-10-18: qty 2, 28d supply, fill #0

## 2021-10-23 ENCOUNTER — Ambulatory Visit (INDEPENDENT_AMBULATORY_CARE_PROVIDER_SITE_OTHER): Payer: No Typology Code available for payment source | Admitting: Bariatrics

## 2021-10-23 ENCOUNTER — Encounter (INDEPENDENT_AMBULATORY_CARE_PROVIDER_SITE_OTHER): Payer: Self-pay | Admitting: Bariatrics

## 2021-10-23 ENCOUNTER — Other Ambulatory Visit: Payer: Self-pay

## 2021-10-23 VITALS — BP 131/75 | HR 110 | Temp 98.7°F | Ht 66.0 in | Wt 228.0 lb

## 2021-10-23 DIAGNOSIS — E8881 Metabolic syndrome: Secondary | ICD-10-CM | POA: Diagnosis not present

## 2021-10-23 DIAGNOSIS — E559 Vitamin D deficiency, unspecified: Secondary | ICD-10-CM

## 2021-10-23 DIAGNOSIS — Z6839 Body mass index (BMI) 39.0-39.9, adult: Secondary | ICD-10-CM

## 2021-10-23 MED ORDER — VITAMIN D (ERGOCALCIFEROL) 1.25 MG (50000 UNIT) PO CAPS: 50000.0000 [IU] | ORAL_CAPSULE | ORAL | 0 refills | Status: DC

## 2021-10-24 NOTE — Progress Notes (Signed)
Chief Complaint:   OBESITY [name] is here to discuss her progress with her obesity treatment plan along with follow-up of her obesity related diagnoses. [name] is on the Category 3 Plan and states she is following her eating plan approximately 90% of the time. [name] states she is walking for 30 minutes 3 times per week.  Today's visit was #: 5 Starting weight: 244 lbs Starting date: 09/07/2021 Today's weight: 228 lbs Today's date: 10/23/2021 Total lbs lost to date: 16 lbs Total lbs lost since last in-office visit: 8 lbs  Interim History: [name] is down 8 lbs since her last visit and doing well overall. She is doing well with her water but struggles with protein.   Subjective:   1. Insulin resistance [name] is taking Mounjaro currently. She denies significant side effects.  2. Vitamin D deficiency [name] is currently taking Vitamin D.   Assessment/Plan:   1. Insulin resistance [name] will continue Mounjaro. She will continue to work on weight loss, exercise, and decreasing simple carbohydrates to help decrease the risk of diabetes. [name] agreed to follow-up with [place] as directed to closely monitor her progress.  2. Vitamin D deficiency Low Vitamin D level contributes to fatigue and are associated with obesity, breast, and colon cancer. We will refill prescription Vitamin D 50,000 IU every week for 1 month with no refills and [name] will follow-up for routine testing of Vitamin D, at least 2-3 times per year to avoid over-replacement.  - Vitamin D, Ergocalciferol, (DRISDOL) 1.25 MG (50000 UNIT) CAPS capsule; Take 1 capsule (50,000 Units total) by mouth every 7 (seven) days.  Dispense: 4 capsule; Refill: 0  3. Class 2 severe obesity with serious comorbidity and body mass index (BMI) of 39.0 to 39.9 in adult, unspecified obesity type ([name]) [name] is currently in the action stage of change. As such, her goal is to continue with weight loss efforts. She has agreed to the Category 3 Plan.    [name] will continue meal planning. She will continue to adhere closely to the plan.  Exercise goals:  [name] will increase exercise and walking.  Behavioral modification strategies: increasing lean protein intake, decreasing simple carbohydrates, increasing vegetables, increasing water intake, decreasing eating out, no skipping meals, meal planning and cooking strategies, keeping healthy foods in the home, and planning for success.  [name] has agreed to follow-up with our clinic in 2 weeks. She was informed of the importance of frequent follow-up visits to maximize her success with intensive lifestyle modifications for her multiple health conditions.   Objective:   Blood pressure 131/75, pulse (!) 110, temperature 98.7 F (37.1 C), height 5\' 6"  (1.676 m), weight 228 lb (103.4 kg), SpO2 100 %. Body mass index is 36.8 kg/m.  General: Cooperative, alert, well developed, in no acute distress. HEENT: Conjunctivae and lids unremarkable. Cardiovascular: Regular rhythm.  Lungs: Normal work of breathing. Neurologic: No focal deficits.   Lab Results  Component Value Date   CREATININE 0.90 09/07/2021   BUN 11 09/07/2021   NA 138 09/07/2021   K 4.2 09/07/2021   CL 101 09/07/2021   CO2 23 09/07/2021   Lab Results  Component Value Date   ALT 14 09/07/2021   AST 16 09/07/2021   ALKPHOS 56 09/07/2021   BILITOT 0.4 09/07/2021   Lab Results  Component Value Date   HGBA1C 5.3 09/07/2021   HGBA1C 5.0 11/24/2019   HGBA1C 5.0 12/08/2018   HGBA1C 5.2 09/11/2018   HGBA1C 5.1 06/12/2018   Lab Results  Component Value Date   INSULIN 9.3 09/07/2021   INSULIN 9.0 12/08/2018   INSULIN 9.7 09/11/2018   Lab Results  Component Value Date   TSH 0.684 09/07/2021   Lab Results  Component Value Date   CHOL 207 (H) 09/07/2021   HDL 61 09/07/2021   LDLCALC 130 (H) 09/07/2021   TRIG 92 09/07/2021   CHOLHDL 3.8 11/24/2019   Lab Results  Component Value Date   VD25OH 32.9 09/07/2021    VD25OH 36.9 12/08/2018   VD25OH 30.9 09/11/2018   Lab Results  Component Value Date   WBC 5.8 11/24/2019   HGB 12.9 11/24/2019   HCT 39.7 11/24/2019   MCV 90 11/24/2019   PLT 318 11/24/2019   No results found for: IRON, TIBC, FERRITIN  Attestation Statements:   Reviewed by clinician on day of visit: allergies, medications, problem list, medical history, surgical history, family history, social history, and previous encounter notes.  I, Lizbeth Bark, RMA, am acting as Location manager for [employer], DO.   I have reviewed the above documentation for accuracy and completeness, and I agree with the above. Jearld Lesch, DO

## 2021-11-13 ENCOUNTER — Other Ambulatory Visit: Payer: Self-pay

## 2021-11-13 ENCOUNTER — Ambulatory Visit (INDEPENDENT_AMBULATORY_CARE_PROVIDER_SITE_OTHER): Payer: No Typology Code available for payment source | Admitting: Bariatrics

## 2021-11-13 ENCOUNTER — Encounter (INDEPENDENT_AMBULATORY_CARE_PROVIDER_SITE_OTHER): Payer: Self-pay | Admitting: Bariatrics

## 2021-11-13 VITALS — BP 133/81 | HR 124 | Temp 98.3°F | Ht 66.0 in | Wt 222.0 lb

## 2021-11-13 DIAGNOSIS — E8881 Metabolic syndrome: Secondary | ICD-10-CM

## 2021-11-13 DIAGNOSIS — Z6839 Body mass index (BMI) 39.0-39.9, adult: Secondary | ICD-10-CM

## 2021-11-13 DIAGNOSIS — E559 Vitamin D deficiency, unspecified: Secondary | ICD-10-CM | POA: Diagnosis not present

## 2021-11-13 DIAGNOSIS — F3289 Other specified depressive episodes: Secondary | ICD-10-CM

## 2021-11-13 DIAGNOSIS — E6609 Other obesity due to excess calories: Secondary | ICD-10-CM | POA: Diagnosis not present

## 2021-11-13 MED ORDER — BUPROPION HCL ER (SR) 100 MG PO TB12: 100.0000 mg | ORAL_TABLET | Freq: Every day | ORAL | 0 refills | Status: DC

## 2021-11-13 MED ORDER — VITAMIN D (ERGOCALCIFEROL) 1.25 MG (50000 UNIT) PO CAPS: 50000.0000 [IU] | ORAL_CAPSULE | ORAL | 0 refills | Status: DC

## 2021-11-13 MED ORDER — TIRZEPATIDE 5 MG/0.5ML ~~LOC~~ SOAJ: 5.0000 mg | SUBCUTANEOUS | 0 refills | Status: DC

## 2021-11-13 NOTE — Progress Notes (Signed)
Chief Complaint:   OBESITY [name] is here to discuss her progress with her obesity treatment plan along with follow-up of her obesity related diagnoses. [name] is on the Category 3 Plan and states she is following her eating plan approximately 75% of the time. [name] states she is doing 0 minutes 0 times per week.  Today's visit was #: 6 Starting weight: 244 lbs Starting date: 09/07/2021 Today's weight: 222 lbs Today's date: 11/13/2021 Total lbs lost to date: 22 lbs Total lbs lost since last in-office visit: 6 lbs  Interim History: [name] is down 6 lbs from her last visit and doing well overall. She got sick over the holidays.  Subjective:   1. Insulin resistance [name] is taking her medications as directed.   2. Vitamin D deficiency [name] is limited sun exposure.   3. Other depression [name] notes anxiety.  Assessment/Plan:   1. Insulin resistance [name] will continue to work on weight loss, exercise, and decreasing simple carbohydrates to help decrease the risk of diabetes. We will refill Mounjaro 5 mg with no refills. Lovey agreed to follow-up with [place] as directed to closely monitor her progress.  - tirzepatide [facility]) 5 MG/0.5ML Pen; Inject 5 mg into the skin once a week.  Dispense: 2 mL; Refill: 0  2. Vitamin D deficiency Low Vitamin D level contributes to fatigue and are associated with obesity, breast, and colon cancer. We will refill prescription Vitamin D 50,000 IU every week for 1 month with no refills and [name] will follow-up for routine testing of Vitamin D, at least 2-3 times per year to avoid over-replacement.  - Vitamin D, Ergocalciferol, (DRISDOL) 1.25 MG (50000 UNIT) CAPS capsule; Take 1 capsule (50,000 Units total) by mouth every 7 (seven) days.  Dispense: 4 capsule; Refill: 0  3. Other depression Behavior modification techniques were discussed today to help [name] deal with her emotional/non-hunger eating behaviors.  We will refill Wellbutrin 100 mg for 1  month with no refills. Orders and follow up as documented in patient record.    - buPROPion ER (WELLBUTRIN SR) 100 MG 12 hr tablet; Take 1 tablet (100 mg total) by mouth daily.  Dispense: 30 tablet; Refill: 0  4. Obesity, current BMI 35.9 [name] is currently in the action stage of change. As such, her goal is to continue with weight loss efforts. She has agreed to the Category 3 Plan.   [name] will continue meal planning and she will continue intentional eating.   Exercise goals:  [name] will consider other options for exercise.  Behavioral modification strategies: increasing lean protein intake, decreasing simple carbohydrates, increasing vegetables, increasing water intake, decreasing eating out, no skipping meals, meal planning and cooking strategies, keeping healthy foods in the home, and planning for success.  [name] has agreed to follow-up with our clinic in 3 weeks with Mina Marble, NP or Jake Bathe, FNP or  Abby Potash, PA-C and 6 weeks with myself. She was informed of the importance of frequent follow-up visits to maximize her success with intensive lifestyle modifications for her multiple health conditions.   Objective:   Blood pressure 133/81, pulse (!) 124, temperature 98.3 F (36.8 C), height 5\' 6"  (1.676 m), weight 222 lb (100.7 kg), SpO2 100 %. Body mass index is 35.83 kg/m.  General: Cooperative, alert, well developed, in no acute distress. HEENT: Conjunctivae and lids unremarkable. Cardiovascular: Regular rhythm.  Lungs: Normal work of breathing. Neurologic: No focal deficits.   Lab Results  Component Value Date   CREATININE  0.90 09/07/2021   BUN 11 09/07/2021   NA 138 09/07/2021   K 4.2 09/07/2021   CL 101 09/07/2021   CO2 23 09/07/2021   Lab Results  Component Value Date   ALT 14 09/07/2021   AST 16 09/07/2021   ALKPHOS 56 09/07/2021   BILITOT 0.4 09/07/2021   Lab Results  Component Value Date   HGBA1C 5.3 09/07/2021   HGBA1C 5.0 11/24/2019    HGBA1C 5.0 12/08/2018   HGBA1C 5.2 09/11/2018   HGBA1C 5.1 06/12/2018   Lab Results  Component Value Date   INSULIN 9.3 09/07/2021   INSULIN 9.0 12/08/2018   INSULIN 9.7 09/11/2018   Lab Results  Component Value Date   TSH 0.684 09/07/2021   Lab Results  Component Value Date   CHOL 207 (H) 09/07/2021   HDL 61 09/07/2021   LDLCALC 130 (H) 09/07/2021   TRIG 92 09/07/2021   CHOLHDL 3.8 11/24/2019   Lab Results  Component Value Date   VD25OH 32.9 09/07/2021   VD25OH 36.9 12/08/2018   VD25OH 30.9 09/11/2018   Lab Results  Component Value Date   WBC 5.8 11/24/2019   HGB 12.9 11/24/2019   HCT 39.7 11/24/2019   MCV 90 11/24/2019   PLT 318 11/24/2019   No results found for: IRON, TIBC, FERRITIN  Attestation Statements:   Reviewed by clinician on day of visit: allergies, medications, problem list, medical history, surgical history, family history, social history, and previous encounter notes.  I, Lizbeth Bark, RMA, am acting as Location manager for [employer], DO.   I have reviewed the above documentation for accuracy and completeness, and I agree with the above. Jearld Lesch, DO

## 2021-11-14 ENCOUNTER — Encounter (INDEPENDENT_AMBULATORY_CARE_PROVIDER_SITE_OTHER): Payer: Self-pay | Admitting: Bariatrics

## 2021-12-04 ENCOUNTER — Other Ambulatory Visit (INDEPENDENT_AMBULATORY_CARE_PROVIDER_SITE_OTHER): Payer: Self-pay | Admitting: Family Medicine

## 2021-12-04 ENCOUNTER — Other Ambulatory Visit: Payer: Self-pay

## 2021-12-04 ENCOUNTER — Ambulatory Visit (INDEPENDENT_AMBULATORY_CARE_PROVIDER_SITE_OTHER): Payer: No Typology Code available for payment source | Admitting: Family Medicine

## 2021-12-04 ENCOUNTER — Encounter (INDEPENDENT_AMBULATORY_CARE_PROVIDER_SITE_OTHER): Payer: Self-pay | Admitting: Family Medicine

## 2021-12-04 VITALS — BP 143/82 | HR 77 | Temp 98.9°F | Ht 66.0 in | Wt 217.0 lb

## 2021-12-04 DIAGNOSIS — Z9189 Other specified personal risk factors, not elsewhere classified: Secondary | ICD-10-CM | POA: Diagnosis not present

## 2021-12-04 DIAGNOSIS — E8881 Metabolic syndrome: Secondary | ICD-10-CM | POA: Diagnosis not present

## 2021-12-04 DIAGNOSIS — E559 Vitamin D deficiency, unspecified: Secondary | ICD-10-CM

## 2021-12-04 DIAGNOSIS — F3289 Other specified depressive episodes: Secondary | ICD-10-CM | POA: Diagnosis not present

## 2021-12-04 DIAGNOSIS — Z6839 Body mass index (BMI) 39.0-39.9, adult: Secondary | ICD-10-CM

## 2021-12-04 DIAGNOSIS — E6609 Other obesity due to excess calories: Secondary | ICD-10-CM

## 2021-12-04 DIAGNOSIS — K5909 Other constipation: Secondary | ICD-10-CM

## 2021-12-04 MED ORDER — BUPROPION HCL ER (SR) 100 MG PO TB12
100.0000 mg | ORAL_TABLET | Freq: Every day | ORAL | 0 refills | Status: DC
Start: 2021-12-04 — End: 2021-12-25

## 2021-12-04 MED ORDER — TIRZEPATIDE 5 MG/0.5ML ~~LOC~~ SOAJ: 5.0000 mg | SUBCUTANEOUS | 0 refills | Status: DC

## 2021-12-04 MED ORDER — VITAMIN D (ERGOCALCIFEROL) 1.25 MG (50000 UNIT) PO CAPS: 50000.0000 [IU] | ORAL_CAPSULE | ORAL | 0 refills | Status: DC

## 2021-12-05 ENCOUNTER — Other Ambulatory Visit (INDEPENDENT_AMBULATORY_CARE_PROVIDER_SITE_OTHER): Payer: Self-pay | Admitting: Family Medicine

## 2021-12-05 DIAGNOSIS — E8881 Metabolic syndrome: Secondary | ICD-10-CM

## 2021-12-05 MED ORDER — POLYETHYLENE GLYCOL 3350 17 GM/SCOOP PO POWD: 17.0000 g | Freq: Two times a day (BID) | ORAL | 1 refills | Status: DC | PRN

## 2021-12-05 NOTE — Telephone Encounter (Signed)
Dr.Beasley 

## 2021-12-05 NOTE — Telephone Encounter (Signed)
[place], thank you [place].

## 2021-12-05 NOTE — Telephone Encounter (Signed)
Please try to change to ozempic .25 x 1 month

## 2021-12-05 NOTE — Telephone Encounter (Signed)
Please see message and advise.  Thank you. ° °

## 2021-12-05 NOTE — Progress Notes (Signed)
Chief Complaint:   OBESITY [name] is here to discuss her progress with her obesity treatment plan along with follow-up of her obesity related diagnoses. [name] is on the Category 3 Plan and states she is following her eating plan approximately 82% of the time. [name] states she is doing 0 minutes 0 times per week.  Today's visit was #: 6 Starting weight: 244 lbs Starting date: 09/07/2021 Today's weight: 217 lbs Today's date: 12/04/2021 Total lbs lost to date: 27 Total lbs lost since last in-office visit: 5  Interim History: [name] continues to do well with weight loss. She still struggles to meet her protein goals. She is open to discussing ways to increase protein.  Subjective:   1. Insulin resistance [name] is working on diet and weight loss, and she is doing well on [name].  2. Other constipation [name] notes increased constipation especially with [name]. She is trying to increase her fiber and water intake.  3. Vitamin D deficiency [name] is stable with no side effects noted.  4. Other depression with emotional eating [name] is doing well with decreasing emotional eating behaviors. Her mood is good and she is mindful of her eating.  5. At risk for heart disease [name] is at a higher than average risk for cardiovascular disease due to obesity.   Assessment/Plan:   1. Insulin resistance [name] will continue to work on weight loss, exercise, and decreasing simple carbohydrates to help decrease the risk of diabetes. We will refill [name] for 1 month. [name] agreed to follow-up with [place] as directed to closely monitor her progress.  - tirzepatide [name]) 5 MG/0.5ML Pen; Inject 5 mg into the skin once a week.  Dispense: 2 mL; Refill: 0  2. Other constipation [name] agreed to start miralax OTC 17 g daily with no refills, and increase her water intake. She was informed that a decrease in bowel movement frequency is normal while losing weight, but stools should not be hard or  painful. Orders and follow up as documented in patient record.   3. Vitamin D deficiency [name] contributes to fatigue and are associated with obesity, breast, and colon cancer. We will refill prescription Vitamin D for 1 month. [name] will follow-up for routine testing of Vitamin D, at least 2-3 times per year to avoid over-replacement.  - Vitamin D, Ergocalciferol, (DRISDOL) 1.25 MG (50000 UNIT) CAPS capsule; Take 1 capsule (50,000 Units total) by mouth every 7 (seven) days.  Dispense: 4 capsule; Refill: 0  4. Other depression with emotional eating We will refill Wellbutrin SR Behavior modification techniques were discussed today to help [name] deal with her emotional/non-hunger eating behaviors. Orders and follow up as documented in patient record.   - buPROPion ER (WELLBUTRIN SR) 100 MG 12 hr tablet; Take 1 tablet (100 mg total) by mouth daily.  Dispense: 30 tablet; Refill: 0  5. At risk for heart disease [name] was given approximately 15 minutes of coronary artery disease prevention counseling today. She is 45 y.o. female and has risk factors for heart disease including obesity. We discussed intensive lifestyle modifications today with an emphasis on specific weight loss instructions and strategies.   Repetitive spaced learning was employed today to elicit superior memory formation and behavioral change.  6. Obesity, current BMI 35.9 [name] is currently in the action stage of change. As such, her goal is to continue with weight loss efforts. She has agreed to keeping a food journal and adhering to recommended goals of 1200-1500 calories and  85+ grams of protein daily.   Behavioral modification strategies: increasing lean protein intake, meal planning and cooking strategies, and holiday eating strategies .  [name] has agreed to follow-up with our clinic in 3 to 4 weeks. She was informed of the importance of frequent follow-up visits to maximize her success with intensive lifestyle  modifications for her multiple health conditions.   Objective:   Blood pressure (!) 143/82, pulse 77, temperature 98.9 F (37.2 C), height 5\' 6"  (1.676 m), weight 217 lb (98.4 kg), SpO2 97 %. Body mass index is 35.02 kg/m.  General: Cooperative, alert, well developed, in no acute distress. HEENT: Conjunctivae and lids unremarkable. Cardiovascular: Regular rhythm.  Lungs: Normal work of breathing. Neurologic: No focal deficits.   Lab Results  Component Value Date   CREATININE 0.90 09/07/2021   BUN 11 09/07/2021   NA 138 09/07/2021   K 4.2 09/07/2021   CL 101 09/07/2021   CO2 23 09/07/2021   Lab Results  Component Value Date   ALT 14 09/07/2021   AST 16 09/07/2021   ALKPHOS 56 09/07/2021   BILITOT 0.4 09/07/2021   Lab Results  Component Value Date   HGBA1C 5.3 09/07/2021   HGBA1C 5.0 11/24/2019   HGBA1C 5.0 12/08/2018   HGBA1C 5.2 09/11/2018   HGBA1C 5.1 06/12/2018   Lab Results  Component Value Date   INSULIN 9.3 09/07/2021   INSULIN 9.0 12/08/2018   INSULIN 9.7 09/11/2018   Lab Results  Component Value Date   TSH 0.684 09/07/2021   Lab Results  Component Value Date   CHOL 207 (H) 09/07/2021   HDL 61 09/07/2021   LDLCALC 130 (H) 09/07/2021   TRIG 92 09/07/2021   CHOLHDL 3.8 11/24/2019   Lab Results  Component Value Date   VD25OH 32.9 09/07/2021   VD25OH 36.9 12/08/2018   VD25OH 30.9 09/11/2018   Lab Results  Component Value Date   WBC 5.8 11/24/2019   HGB 12.9 11/24/2019   HCT 39.7 11/24/2019   MCV 90 11/24/2019   PLT 318 11/24/2019   No results found for: IRON, TIBC, FERRITIN  Attestation Statements:   Reviewed by clinician on day of visit: allergies, medications, problem list, medical history, surgical history, family history, social history, and previous encounter notes.   I, Trixie Dredge, am acting as transcriptionist for Dennard Nip, MD.  I have reviewed the above documentation for accuracy and completeness, and I agree with the  above. -  Dennard Nip, MD

## 2021-12-05 NOTE — Telephone Encounter (Signed)
Prior authorization is not required for Ozempic.I also received this message from covermymeds:"Please advise the dispensing pharmacy to [place] at [phone] for assistance." This message can occur if the PA is not needed.

## 2021-12-14 ENCOUNTER — Other Ambulatory Visit (INDEPENDENT_AMBULATORY_CARE_PROVIDER_SITE_OTHER): Payer: Self-pay | Admitting: Bariatrics

## 2021-12-14 DIAGNOSIS — E8881 Metabolic syndrome: Secondary | ICD-10-CM

## 2021-12-19 NOTE — Telephone Encounter (Signed)
Dr.Beasley 

## 2021-12-25 ENCOUNTER — Other Ambulatory Visit: Payer: Self-pay

## 2021-12-25 ENCOUNTER — Encounter (INDEPENDENT_AMBULATORY_CARE_PROVIDER_SITE_OTHER): Payer: Self-pay | Admitting: Bariatrics

## 2021-12-25 ENCOUNTER — Ambulatory Visit (INDEPENDENT_AMBULATORY_CARE_PROVIDER_SITE_OTHER): Payer: No Typology Code available for payment source | Admitting: Bariatrics

## 2021-12-25 VITALS — BP 123/78 | HR 99 | Temp 98.5°F | Ht 66.0 in | Wt 218.0 lb

## 2021-12-25 DIAGNOSIS — Z6835 Body mass index (BMI) 35.0-35.9, adult: Secondary | ICD-10-CM

## 2021-12-25 DIAGNOSIS — E8881 Metabolic syndrome: Secondary | ICD-10-CM | POA: Diagnosis not present

## 2021-12-25 DIAGNOSIS — E559 Vitamin D deficiency, unspecified: Secondary | ICD-10-CM

## 2021-12-25 DIAGNOSIS — K5909 Other constipation: Secondary | ICD-10-CM

## 2021-12-25 DIAGNOSIS — F3289 Other specified depressive episodes: Secondary | ICD-10-CM | POA: Diagnosis not present

## 2021-12-25 DIAGNOSIS — Z6839 Body mass index (BMI) 39.0-39.9, adult: Secondary | ICD-10-CM

## 2021-12-25 DIAGNOSIS — E6609 Other obesity due to excess calories: Secondary | ICD-10-CM

## 2021-12-25 MED ORDER — POLYETHYLENE GLYCOL 3350 17 GM/SCOOP PO POWD: 17.0000 g | Freq: Two times a day (BID) | ORAL | 0 refills | Status: AC | PRN

## 2021-12-25 MED ORDER — BUPROPION HCL ER (SR) 100 MG PO TB12: 100.0000 mg | ORAL_TABLET | Freq: Every day | ORAL | 0 refills | Status: DC

## 2021-12-26 ENCOUNTER — Encounter (INDEPENDENT_AMBULATORY_CARE_PROVIDER_SITE_OTHER): Payer: Self-pay | Admitting: Bariatrics

## 2021-12-26 NOTE — Progress Notes (Signed)
Chief Complaint:   OBESITY [name] is here to discuss her progress with her obesity treatment plan along with follow-up of her obesity related diagnoses. [name] is on the Category 3 Plan and states she is following her eating plan approximately 30% of the time. [name] states she is doing 0 minutes 0 times per week.  Today's visit was #: 7 Starting weight: 244 lbs Starting date: 09/07/2021 Today's weight: 218 lbs Today's date: 12/26/2021 Total lbs lost to date: 26 lbs Total lbs lost since last in-office visit: 0  Interim History: [name] is up 1 lb over the holidays.  Subjective:   1. Other constipation [name] notes constipation.   2. Insulin resistance [name] stopped taking Mounjaro.  3. Vitamin D deficiency She is currently taking prescription vitamin D 50,000 IU each week. She denies nausea, vomiting or muscle weakness.  4. Other depression with emotional eating [name] is struggling with emotional eating and using food for comfort to the extent that it is negatively impacting her health. She has been working on behavior modification techniques to help reduce her emotional eating. She shows no sign of suicidal or homicidal ideations.   Assessment/Plan:   1. Other constipation [name] was informed that a decrease in bowel movement frequency is normal while losing weight, but stools should not be hard or painful. We will refill Miralax Powder with no refills. Orders and follow up as documented in patient record.   Counseling Getting to Good Bowel Health: Your goal is to have one soft bowel movement each day. Drink at least 8 glasses of water each day. Eat plenty of fiber (goal is over 25 grams each day). It is best to get most of your fiber from dietary sources which includes leafy green vegetables, fresh fruit, and whole grains. You may need to add fiber with the help of OTC fiber supplements. These include Metamucil, Citrucel, and Flaxseed. If you are still having trouble, try adding  Miralax or Magnesium Citrate. If all of these changes do not work, Cabin crew.   - polyethylene glycol powder (GLYCOLAX/MIRALAX) 17 GM/SCOOP powder; Take 17 g by mouth 2 (two) times daily as needed.  Dispense: 3350 g; Refill: 0  2. Insulin resistance [name] agrees to start Ozempic 0.25 weekly with no refills. She will continue to work on weight loss, exercise, and decreasing simple carbohydrates to help decrease the risk of diabetes. Khamya agreed to follow-up with [place] as directed to closely monitor her progress.  -Semaglutide, 0.25 or 0.5 MG/DOS, (Ozempic,  0.25 or 0.5 MG/Dose,) 2 MG/1.5 ML SOPN. Subcutaneous weekly. Dispense: Refills:0    3. Vitamin D deficiency Low Vitamin D level contributes to fatigue and are associated with obesity, breast, and colon cancer. [name] agrees to continue to take prescription Vitamin D 50,000 IU every week and she will follow-up for routine testing of Vitamin D, at least 2-3 times per year to avoid over-replacement.  4. Other depression with emotional eating We will refill Wellbutrin SR 100 mg for 1 month with no refills. Behavior modification techniques were discussed today to help [name] deal with her emotional/non-hunger eating behaviors.  Orders and follow up as documented in patient record.    - buPROPion ER (WELLBUTRIN SR) 100 MG 12 hr tablet; Take 1 tablet (100 mg total) by mouth daily.  Dispense: 30 tablet; Refill: 0  5. Obesity, current BMI 35.3 [name] is currently in the action stage of change. As such, her goal is to continue with weight loss efforts. She has  agreed to the Category 3 Plan.   [name] will continue meal planning. She will adhere closely to the plan. She will get back on her protein and water intake.   Exercise goals:  [name] will start using the treadmill.  Behavioral modification strategies: increasing lean protein intake, decreasing simple carbohydrates, increasing vegetables, increasing water intake, decreasing eating out, no  skipping meals, meal planning and cooking strategies, keeping healthy foods in the home, and planning for success.  [name] has agreed to follow-up with our clinic in 3 weeks. She was informed of the importance of frequent follow-up visits to maximize her success with intensive lifestyle modifications for her multiple health conditions.   Objective:   Blood pressure 123/78, pulse 99, temperature 98.5 F (36.9 C), height 5\' 6"  (1.676 m), weight 218 lb (98.9 kg), SpO2 95 %. Body mass index is 35.19 kg/m.  General: Cooperative, alert, well developed, in no acute distress. HEENT: Conjunctivae and lids unremarkable. Cardiovascular: Regular rhythm.  Lungs: Normal work of breathing. Neurologic: No focal deficits.   Lab Results  Component Value Date   CREATININE 0.90 09/07/2021   BUN 11 09/07/2021   NA 138 09/07/2021   K 4.2 09/07/2021   CL 101 09/07/2021   CO2 23 09/07/2021   Lab Results  Component Value Date   ALT 14 09/07/2021   AST 16 09/07/2021   ALKPHOS 56 09/07/2021   BILITOT 0.4 09/07/2021   Lab Results  Component Value Date   HGBA1C 5.3 09/07/2021   HGBA1C 5.0 11/24/2019   HGBA1C 5.0 12/08/2018   HGBA1C 5.2 09/11/2018   HGBA1C 5.1 06/12/2018   Lab Results  Component Value Date   INSULIN 9.3 09/07/2021   INSULIN 9.0 12/08/2018   INSULIN 9.7 09/11/2018   Lab Results  Component Value Date   TSH 0.684 09/07/2021   Lab Results  Component Value Date   CHOL 207 (H) 09/07/2021   HDL 61 09/07/2021   LDLCALC 130 (H) 09/07/2021   TRIG 92 09/07/2021   CHOLHDL 3.8 11/24/2019   Lab Results  Component Value Date   VD25OH 32.9 09/07/2021   VD25OH 36.9 12/08/2018   VD25OH 30.9 09/11/2018   Lab Results  Component Value Date   WBC 5.8 11/24/2019   HGB 12.9 11/24/2019   HCT 39.7 11/24/2019   MCV 90 11/24/2019   PLT 318 11/24/2019   No results found for: IRON, TIBC, FERRITIN  Attestation Statements:   Reviewed by clinician on day of visit: allergies,  medications, problem list, medical history, surgical history, family history, social history, and previous encounter notes.  I, Lizbeth Bark, RMA, am acting as Location manager for [employer], DO.  I have reviewed the above documentation for accuracy and completeness, and I agree with the above. Jearld Lesch, DO

## 2022-01-17 ENCOUNTER — Encounter (INDEPENDENT_AMBULATORY_CARE_PROVIDER_SITE_OTHER): Payer: Self-pay | Admitting: Family Medicine

## 2022-01-17 ENCOUNTER — Other Ambulatory Visit: Payer: Self-pay

## 2022-01-17 ENCOUNTER — Ambulatory Visit (INDEPENDENT_AMBULATORY_CARE_PROVIDER_SITE_OTHER): Payer: No Typology Code available for payment source | Admitting: Family Medicine

## 2022-01-17 ENCOUNTER — Ambulatory Visit (INDEPENDENT_AMBULATORY_CARE_PROVIDER_SITE_OTHER): Payer: No Typology Code available for payment source | Admitting: Bariatrics

## 2022-01-17 VITALS — BP 134/79 | HR 99 | Temp 98.7°F | Ht 66.0 in | Wt 216.0 lb

## 2022-01-17 DIAGNOSIS — E8881 Metabolic syndrome: Secondary | ICD-10-CM

## 2022-01-17 DIAGNOSIS — F3289 Other specified depressive episodes: Secondary | ICD-10-CM | POA: Diagnosis not present

## 2022-01-17 DIAGNOSIS — E669 Obesity, unspecified: Secondary | ICD-10-CM | POA: Diagnosis not present

## 2022-01-17 DIAGNOSIS — Z6835 Body mass index (BMI) 35.0-35.9, adult: Secondary | ICD-10-CM

## 2022-01-17 DIAGNOSIS — E6609 Other obesity due to excess calories: Secondary | ICD-10-CM

## 2022-01-17 DIAGNOSIS — E559 Vitamin D deficiency, unspecified: Secondary | ICD-10-CM | POA: Diagnosis not present

## 2022-01-17 DIAGNOSIS — Z9189 Other specified personal risk factors, not elsewhere classified: Secondary | ICD-10-CM

## 2022-01-18 NOTE — Progress Notes (Signed)
Chief Complaint:   OBESITY [name] is here to discuss her progress with her obesity treatment plan along with follow-up of her obesity related diagnoses. [name] is on the Category 3 Plan and states she is following her eating plan approximately 40% of the time. [name] states she is doing 0 minutes 0 times per week.  Today's visit was #: 8 Starting weight: 244 lbs Starting date: 09/07/2021 Today's weight: 216 lbs Today's date: 01/17/2022 Total lbs lost to date: 28 Total lbs lost since last in-office visit: 2  Interim History: [name] continues to do well with weight loss. She is following her plan well with breakfast, but she deviates later in the day. She is being mindful and making smarter choices.   Subjective:   1. Insulin resistance [name] was changed from [facility] to Ozempic, but she notes her polyphagia has increased.  2. Vitamin D deficiency [name] is stable on Vit D, and she denies nausea, vomiting, or muscle weakness.  3. Other depression with emotional eating [name] notes increased stress at work. She wonders if a higher dose would help her.  4. At risk for impaired metabolic function [name] is at increased risk for impaired metabolic function if protein decreases.  Assessment/Plan:   1. Insulin resistance Bari agreed to increase Ozempic to 0.5 mg q week with no refills. She will continue to work on weight loss, exercise, and decreasing simple carbohydrates to help decrease the risk of diabetes. [name] agreed to follow-up with [place] as directed to closely monitor her progress.  2. Vitamin D deficiency Low Vitamin D level contributes to fatigue and are associated with obesity, breast, and colon cancer. We will refill prescription Vitamin D 50,000 IU every week #4 for 1 month. [name] will follow-up for routine testing of Vitamin D, at least 2-3 times per year to avoid over-replacement.  3. Other depression with emotional eating [name] agreed to increase Wellbutrin SR to 100 mg BID  #60, and we will refill for 1 month. Behavior modification techniques were discussed today to help [name] deal with her emotional/non-hunger eating behaviors. Orders and follow up as documented in patient record.   4. At risk for impaired metabolic function [name] was given approximately 15 minutes of impaired  metabolic function prevention counseling today. We discussed intensive lifestyle modifications today with an emphasis on specific nutrition and exercise instructions and strategies.   Repetitive spaced learning was employed today to elicit superior memory formation and behavioral change.  5. Obesity, current BMI 35.0 [name] is currently in the action stage of change. As such, her goal is to continue with weight loss efforts. She has agreed to the Category 3 Plan.   Behavioral modification strategies: increasing lean protein intake and emotional eating strategies.  [name] has agreed to follow-up with our clinic in 3 to 4 weeks. She was informed of the importance of frequent follow-up visits to maximize her success with intensive lifestyle modifications for her multiple health conditions.   Objective:   Blood pressure 134/79, pulse 99, temperature 98.7 F (37.1 C), height 5\' 6"  (1.676 m), weight 216 lb (98 kg), SpO2 99 %. Body mass index is 34.86 kg/m.  General: Cooperative, alert, well developed, in no acute distress. HEENT: Conjunctivae and lids unremarkable. Cardiovascular: Regular rhythm.  Lungs: Normal work of breathing. Neurologic: No focal deficits.   Lab Results  Component Value Date   CREATININE 0.90 09/07/2021   BUN 11 09/07/2021   NA 138 09/07/2021   K 4.2 09/07/2021   CL 101  09/07/2021   CO2 23 09/07/2021   Lab Results  Component Value Date   ALT 14 09/07/2021   AST 16 09/07/2021   ALKPHOS 56 09/07/2021   BILITOT 0.4 09/07/2021   Lab Results  Component Value Date   HGBA1C 5.3 09/07/2021   HGBA1C 5.0 11/24/2019   HGBA1C 5.0 12/08/2018   HGBA1C 5.2  09/11/2018   HGBA1C 5.1 06/12/2018   Lab Results  Component Value Date   INSULIN 9.3 09/07/2021   INSULIN 9.0 12/08/2018   INSULIN 9.7 09/11/2018   Lab Results  Component Value Date   TSH 0.684 09/07/2021   Lab Results  Component Value Date   CHOL 207 (H) 09/07/2021   HDL 61 09/07/2021   LDLCALC 130 (H) 09/07/2021   TRIG 92 09/07/2021   CHOLHDL 3.8 11/24/2019   Lab Results  Component Value Date   VD25OH 32.9 09/07/2021   VD25OH 36.9 12/08/2018   VD25OH 30.9 09/11/2018   Lab Results  Component Value Date   WBC 5.8 11/24/2019   HGB 12.9 11/24/2019   HCT 39.7 11/24/2019   MCV 90 11/24/2019   PLT 318 11/24/2019   No results found for: IRON, TIBC, FERRITIN  Attestation Statements:   Reviewed by clinician on day of visit: allergies, medications, problem list, medical history, surgical history, family history, social history, and previous encounter notes.   I, [name], am acting as transcriptionist for Dennard Nip, MD.  I have reviewed the above documentation for accuracy and completeness, and I agree with the above. -  Dennard Nip, MD

## 2022-01-24 ENCOUNTER — Other Ambulatory Visit (INDEPENDENT_AMBULATORY_CARE_PROVIDER_SITE_OTHER): Payer: Self-pay | Admitting: Family Medicine

## 2022-01-24 DIAGNOSIS — E8881 Metabolic syndrome: Secondary | ICD-10-CM

## 2022-02-19 ENCOUNTER — Other Ambulatory Visit (INDEPENDENT_AMBULATORY_CARE_PROVIDER_SITE_OTHER): Payer: Self-pay | Admitting: Family Medicine

## 2022-02-19 ENCOUNTER — Ambulatory Visit (INDEPENDENT_AMBULATORY_CARE_PROVIDER_SITE_OTHER): Payer: No Typology Code available for payment source | Admitting: Family Medicine

## 2022-02-19 ENCOUNTER — Other Ambulatory Visit: Payer: Self-pay

## 2022-02-19 VITALS — BP 128/78 | HR 102 | Temp 99.2°F | Ht 66.0 in | Wt 215.0 lb

## 2022-02-19 DIAGNOSIS — E8881 Metabolic syndrome: Secondary | ICD-10-CM

## 2022-02-19 DIAGNOSIS — Z6834 Body mass index (BMI) 34.0-34.9, adult: Secondary | ICD-10-CM

## 2022-02-19 DIAGNOSIS — E559 Vitamin D deficiency, unspecified: Secondary | ICD-10-CM | POA: Diagnosis not present

## 2022-02-19 DIAGNOSIS — E669 Obesity, unspecified: Secondary | ICD-10-CM | POA: Diagnosis not present

## 2022-02-19 DIAGNOSIS — F3289 Other specified depressive episodes: Secondary | ICD-10-CM

## 2022-02-19 DIAGNOSIS — Z9189 Other specified personal risk factors, not elsewhere classified: Secondary | ICD-10-CM

## 2022-02-19 DIAGNOSIS — E6609 Other obesity due to excess calories: Secondary | ICD-10-CM

## 2022-02-19 MED ORDER — OZEMPIC (0.25 OR 0.5 MG/DOSE) 2 MG/1.5ML ~~LOC~~ SOPN: 0.5000 mg | PEN_INJECTOR | SUBCUTANEOUS | 0 refills | Status: AC

## 2022-02-19 MED ORDER — VITAMIN D (ERGOCALCIFEROL) 1.25 MG (50000 UNIT) PO CAPS: 50000.0000 [IU] | ORAL_CAPSULE | ORAL | 0 refills | Status: AC

## 2022-02-19 MED ORDER — BUPROPION HCL ER (SR) 100 MG PO TB12: 100.0000 mg | ORAL_TABLET | Freq: Every day | ORAL | 0 refills | Status: AC

## 2022-02-20 NOTE — Telephone Encounter (Signed)
Dr.Beasley 

## 2022-02-20 NOTE — Telephone Encounter (Signed)
Pharmacy asking for #[phone]

## 2022-02-21 NOTE — Progress Notes (Signed)
? ? ? ?Chief Complaint:  ? ?OBESITY ?[name] is here to discuss her progress with her obesity treatment plan along with follow-up of her obesity related diagnoses. [name] is on the Category 3 Plan and states she is following her eating plan approximately 50% of the time. [name] states she is doing 0 minutes 0 times per week. ? ?Today's visit was #: 9 ?Starting weight: 244 lbs ?Starting date: 09/07/2021 ?Today's weight: 215 lbs ?Today's date: 02/19/2022 ?Total lbs lost to date: 32 ?Total lbs lost since last in-office visit: 1 ? ?Interim History: [name] continue to do well with weight loss. She has not yet started exercising, but she is willing to start. She is stable on her Category 3 plan. ? ?Subjective:  ? ?1. Insulin resistance ?[name] has been off oc Ozempic for approximately 2 weeks. She notes increased polyphagia and would like a refill (shortage [facility]). ? ?2. Vitamin D deficiency ?[name] is stable on Vit D, but her level is not yet at goal. ? ?3. Other depression with emotional eating ?[name]'s mood is stable on her medications. No side effects were noted. She is working on decreasing emotional eating behaviors.  ? ?4. At risk for activity intolerance ?[name] is at risk for exercise intolerance. ? ?Assessment/Plan:  ? ?1. Insulin resistance ?[name] will continue to work on weight loss, exercise, and decreasing simple carbohydrates to help decrease the risk of diabetes. We will refill Ozempic for 1 month. [name] agreed to follow-up with [place] as directed to closely monitor her progress. ? ?- Semaglutide,0.25 or 0.'5MG'$ /DOS, (OZEMPIC, 0.25 OR 0.5 MG/DOSE,) 2 MG/1.5ML SOPN; Inject 0.5 mg into the skin once a week.  Dispense: 0.5 mL; Refill: 0 ? ?2. Vitamin D deficiency ?We will refill prescription Vitamin D 50,000 IU every week for 1 month. She will follow-up for routine testing of Vitamin D, at least 2-3 times per year to avoid over-replacement. ? ?- Vitamin D, Ergocalciferol, (DRISDOL) 1.25 MG (50000 UNIT) CAPS  capsule; Take 1 capsule (50,000 Units total) by mouth every 7 (seven) days.  Dispense: 4 capsule; Refill: 0 ? ?3. Other depression with emotional eating ?Behavior modification techniques were discussed today to help [name] deal with her emotional/non-hunger eating behaviors. We will refill Wellbutrin SR for 1 month. Orders and follow up as documented in patient record.  ? ?- buPROPion ER (WELLBUTRIN SR) 100 MG 12 hr tablet; Take 1 tablet (100 mg total) by mouth daily.  Dispense: 30 tablet; Refill: 0 ? ?4. At risk for activity intolerance ?[name] was given approximately 15 minutes of exercise intolerance counseling today. She is 46 y.o. female and has risk factors exercise intolerance including obesity. We discussed intensive lifestyle modifications today with an emphasis on specific weight loss instructions and strategies. [name] will slowly increase activity as tolerated. ? ?Repetitive spaced learning was employed today to elicit superior memory formation and behavioral change. ? ?5. Obesity, current BMI 34.8 ?[name] is currently in the action stage of change. As such, her goal is to continue with weight loss efforts. She has agreed to the Category 3 Plan.  ? ?Exercise goals: Walking for 20 minutes 3 times per week. ? ?Behavioral modification strategies: increasing lean protein intake and meal planning and cooking strategies. ? ?[name] has agreed to follow-up with our clinic in 3 weeks. She was informed of the importance of frequent follow-up visits to maximize her success with intensive lifestyle modifications for her multiple health conditions.  ? ?Objective:  ? ?Blood pressure 128/78, pulse (!) 102, temperature  99.2 ?F (37.3 ?C), height '5\' 6"'$  (1.676 m), weight 215 lb (97.5 kg), SpO2 99 %. ?Body mass index is 34.7 kg/m?. ? ?General: Cooperative, alert, well developed, in no acute distress. ?HEENT: Conjunctivae and lids unremarkable. ?Cardiovascular: Regular rhythm.  ?Lungs: Normal work of breathing. ?Neurologic: No  focal deficits.  ? ?Lab Results  ?Component Value Date  ? CREATININE 0.90 09/07/2021  ? BUN 11 09/07/2021  ? NA 138 09/07/2021  ? K 4.2 09/07/2021  ? CL 101 09/07/2021  ? CO2 23 09/07/2021  ? ?Lab Results  ?Component Value Date  ? ALT 14 09/07/2021  ? AST 16 09/07/2021  ? ALKPHOS 56 09/07/2021  ? BILITOT 0.4 09/07/2021  ? ?Lab Results  ?Component Value Date  ? HGBA1C 5.3 09/07/2021  ? HGBA1C 5.0 11/24/2019  ? HGBA1C 5.0 12/08/2018  ? HGBA1C 5.2 09/11/2018  ? HGBA1C 5.1 06/12/2018  ? ?Lab Results  ?Component Value Date  ? INSULIN 9.3 09/07/2021  ? INSULIN 9.0 12/08/2018  ? INSULIN 9.7 09/11/2018  ? ?Lab Results  ?Component Value Date  ? TSH 0.684 09/07/2021  ? ?Lab Results  ?Component Value Date  ? CHOL 207 (H) 09/07/2021  ? HDL 61 09/07/2021  ? LDLCALC 130 (H) 09/07/2021  ? TRIG 92 09/07/2021  ? CHOLHDL 3.8 11/24/2019  ? ?Lab Results  ?Component Value Date  ? VD25OH 32.9 09/07/2021  ? VD25OH 36.9 12/08/2018  ? VD25OH 30.9 09/11/2018  ? ?Lab Results  ?Component Value Date  ? WBC 5.8 11/24/2019  ? HGB 12.9 11/24/2019  ? HCT 39.7 11/24/2019  ? MCV 90 11/24/2019  ? PLT 318 11/24/2019  ? ?No results found for: IRON, TIBC, FERRITIN ? ?Attestation Statements:  ? ?Reviewed by clinician on day of visit: allergies, medications, problem list, medical history, surgical history, family history, social history, and previous encounter notes. ? ? ?I, Trixie Dredge, am acting as transcriptionist for Dennard Nip, MD. ? ?I have reviewed the above documentation for accuracy and completeness, and I agree with the above. -  Dennard Nip, MD ? ? ?

## 2022-02-22 MED ORDER — OZEMPIC (0.25 OR 0.5 MG/DOSE) 2 MG/1.5ML ~~LOC~~ SOPN: 0.5000 mg | PEN_INJECTOR | SUBCUTANEOUS | 0 refills | Status: AC

## 2022-02-22 MED ORDER — BUPROPION HCL ER (SR) 150 MG PO TB12: 150.0000 mg | ORAL_TABLET | Freq: Two times a day (BID) | ORAL | 0 refills | Status: AC

## 2022-02-22 MED ORDER — VITAMIN D (ERGOCALCIFEROL) 1.25 MG (50000 UNIT) PO CAPS: 50000.0000 [IU] | ORAL_CAPSULE | ORAL | 0 refills | Status: AC

## 2022-03-12 ENCOUNTER — Ambulatory Visit (INDEPENDENT_AMBULATORY_CARE_PROVIDER_SITE_OTHER): Payer: No Typology Code available for payment source | Admitting: Family Medicine

## 2022-03-19 ENCOUNTER — Other Ambulatory Visit (INDEPENDENT_AMBULATORY_CARE_PROVIDER_SITE_OTHER): Payer: Self-pay | Admitting: Family Medicine

## 2022-03-19 DIAGNOSIS — E8881 Metabolic syndrome: Secondary | ICD-10-CM

## 2022-03-27 NOTE — Telephone Encounter (Signed)
LAST APPOINTMENT DATE: 02/19/22 ?NEXT APPOINTMENT DATE: 04/02/22 ? ? ?[id number] [place] [id number]?Phone: [phone]: [phone]? ?Patient is requesting a refill of the following medications: ?Requested Prescriptions  ? ?Pending Prescriptions Disp Refills  ? Semaglutide,0.25 or 0.'5MG'$ /DOS, (OZEMPIC, 0.25 OR 0.5 MG/DOSE,) 2 MG/1.5ML SOPN [Pharmacy Med Name: OZEMPIC 0.25-0.5 MG/DOSE PEN]    ?  Sig: Inject 0.5 mg into the skin once a week.  ? ? ?Date last filled: 02/22/22 ?Previously prescribed by Dr. BLeafy Ro? ?Lab Results  ?Component Value Date  ? HGBA1C 5.3 09/07/2021  ? HGBA1C 5.0 11/24/2019  ? HGBA1C 5.0 12/08/2018  ? ?Lab Results  ?Component Value Date  ? LDLCALC 130 (H) 09/07/2021  ? CREATININE 0.90 09/07/2021  ? ?Lab Results  ?Component Value Date  ? VD25OH 32.9 09/07/2021  ? VD25OH 36.9 12/08/2018  ? VD25OH 30.9 09/11/2018  ? ? ?BP Readings from Last 3 Encounters:  ?02/19/22 128/78  ?01/17/22 134/79  ?12/25/21 123/78  ? ? ?

## 2022-03-31 ENCOUNTER — Encounter (INDEPENDENT_AMBULATORY_CARE_PROVIDER_SITE_OTHER): Payer: Self-pay | Admitting: Family Medicine

## 2022-04-02 ENCOUNTER — Ambulatory Visit (INDEPENDENT_AMBULATORY_CARE_PROVIDER_SITE_OTHER): Payer: No Typology Code available for payment source | Admitting: Family Medicine

## 2022-07-25 ENCOUNTER — Encounter (INDEPENDENT_AMBULATORY_CARE_PROVIDER_SITE_OTHER): Payer: Self-pay

## 2023-07-25 ENCOUNTER — Other Ambulatory Visit (HOSPITAL_COMMUNITY): Payer: Self-pay

## 2023-07-25 MED ORDER — BUPROPION HCL ER (SR) 100 MG PO TB12
100.0000 mg | ORAL_TABLET | Freq: Every day | ORAL | 2 refills | Status: DC
  Filled 2023-07-25: qty 30, 30d supply, fill #0
  Filled 2023-09-05: qty 30, 30d supply, fill #1
  Filled 2023-11-02: qty 30, 30d supply, fill #2

## 2023-09-06 ENCOUNTER — Other Ambulatory Visit (HOSPITAL_COMMUNITY): Payer: Self-pay

## 2023-11-07 ENCOUNTER — Other Ambulatory Visit (HOSPITAL_COMMUNITY): Payer: Self-pay

## 2023-12-24 ENCOUNTER — Other Ambulatory Visit (HOSPITAL_COMMUNITY): Payer: Self-pay

## 2023-12-24 MED ORDER — BUPROPION HCL ER (SR) 100 MG PO TB12
100.0000 mg | ORAL_TABLET | Freq: Two times a day (BID) | ORAL | 3 refills | Status: AC
  Filled 2023-12-24: qty 60, 30d supply, fill #0
  Filled 2024-02-28: qty 60, 30d supply, fill #1
  Filled 2024-05-15: qty 60, 30d supply, fill #2

## 2023-12-25 ENCOUNTER — Other Ambulatory Visit (HOSPITAL_COMMUNITY): Payer: Self-pay

## 2024-02-28 ENCOUNTER — Other Ambulatory Visit (HOSPITAL_COMMUNITY): Payer: Self-pay

## 2024-03-02 ENCOUNTER — Other Ambulatory Visit (HOSPITAL_COMMUNITY): Payer: Self-pay

## 2024-05-15 ENCOUNTER — Other Ambulatory Visit (HOSPITAL_COMMUNITY): Payer: Self-pay
